# Patient Record
Sex: Female | Born: 1960 | Race: Black or African American | Hispanic: No | Marital: Married | Smoking: Former smoker
Health system: Southern US, Community
[De-identification: ages and names within clinical notes are randomized; demographics above are authoritative.]

## PROBLEM LIST (undated history)

## (undated) DIAGNOSIS — E669 Obesity, unspecified: Secondary | ICD-10-CM

## (undated) DIAGNOSIS — F4310 Post-traumatic stress disorder, unspecified: Secondary | ICD-10-CM

## (undated) DIAGNOSIS — E079 Disorder of thyroid, unspecified: Secondary | ICD-10-CM

## (undated) DIAGNOSIS — F259 Schizoaffective disorder, unspecified: Secondary | ICD-10-CM

## (undated) DIAGNOSIS — K644 Residual hemorrhoidal skin tags: Secondary | ICD-10-CM

## (undated) DIAGNOSIS — F3181 Bipolar II disorder: Secondary | ICD-10-CM

## (undated) DIAGNOSIS — I10 Essential (primary) hypertension: Secondary | ICD-10-CM

## (undated) DIAGNOSIS — R42 Dizziness and giddiness: Secondary | ICD-10-CM

## (undated) DIAGNOSIS — F250 Schizoaffective disorder, bipolar type: Secondary | ICD-10-CM

## (undated) HISTORY — DX: Residual hemorrhoidal skin tags: K64.4

## (undated) HISTORY — DX: Disorder of thyroid, unspecified: E07.9

## (undated) HISTORY — DX: Essential (primary) hypertension: I10

---

## 1996-07-17 HISTORY — PX: CHOLECYSTECTOMY: SHX55

## 2007-09-05 ENCOUNTER — Emergency Department (HOSPITAL_COMMUNITY): Admission: EM | Admit: 2007-09-05 | Discharge: 2007-09-06 | Payer: Self-pay | Admitting: Emergency Medicine

## 2007-12-23 ENCOUNTER — Emergency Department (HOSPITAL_COMMUNITY): Admission: EM | Admit: 2007-12-23 | Discharge: 2007-12-23 | Payer: Self-pay | Admitting: Emergency Medicine

## 2008-01-21 ENCOUNTER — Emergency Department (HOSPITAL_COMMUNITY): Admission: EM | Admit: 2008-01-21 | Discharge: 2008-01-21 | Payer: Self-pay | Admitting: Emergency Medicine

## 2009-07-17 HISTORY — PX: BUNIONECTOMY: SHX129

## 2009-08-30 ENCOUNTER — Emergency Department (HOSPITAL_COMMUNITY): Admission: EM | Admit: 2009-08-30 | Discharge: 2009-08-30 | Payer: Self-pay | Admitting: Emergency Medicine

## 2009-11-03 ENCOUNTER — Ambulatory Visit (HOSPITAL_COMMUNITY): Admission: RE | Admit: 2009-11-03 | Discharge: 2009-11-03 | Payer: Self-pay | Admitting: Obstetrics & Gynecology

## 2009-12-23 ENCOUNTER — Emergency Department (HOSPITAL_COMMUNITY): Admission: EM | Admit: 2009-12-23 | Discharge: 2009-12-23 | Payer: Self-pay | Admitting: Family Medicine

## 2010-09-19 ENCOUNTER — Emergency Department (HOSPITAL_COMMUNITY): Payer: Medicaid Other

## 2010-09-19 ENCOUNTER — Emergency Department (HOSPITAL_COMMUNITY)
Admission: EM | Admit: 2010-09-19 | Discharge: 2010-09-19 | Disposition: A | Discharge status: Home / Self Care | Payer: Medicaid Other | Attending: Emergency Medicine | Admitting: Emergency Medicine

## 2010-09-19 DIAGNOSIS — M542 Cervicalgia: Secondary | ICD-10-CM | POA: Insufficient documentation

## 2010-09-19 DIAGNOSIS — R51 Headache: Secondary | ICD-10-CM | POA: Insufficient documentation

## 2010-09-19 DIAGNOSIS — R5381 Other malaise: Secondary | ICD-10-CM | POA: Insufficient documentation

## 2010-09-19 DIAGNOSIS — Z79899 Other long term (current) drug therapy: Secondary | ICD-10-CM | POA: Insufficient documentation

## 2010-09-19 DIAGNOSIS — E039 Hypothyroidism, unspecified: Secondary | ICD-10-CM | POA: Insufficient documentation

## 2010-09-19 DIAGNOSIS — IMO0002 Reserved for concepts with insufficient information to code with codable children: Secondary | ICD-10-CM | POA: Insufficient documentation

## 2010-09-19 DIAGNOSIS — R350 Frequency of micturition: Secondary | ICD-10-CM | POA: Insufficient documentation

## 2010-09-19 DIAGNOSIS — R209 Unspecified disturbances of skin sensation: Secondary | ICD-10-CM | POA: Insufficient documentation

## 2010-09-19 DIAGNOSIS — M5412 Radiculopathy, cervical region: Secondary | ICD-10-CM | POA: Insufficient documentation

## 2010-09-19 DIAGNOSIS — I10 Essential (primary) hypertension: Secondary | ICD-10-CM | POA: Insufficient documentation

## 2010-09-19 LAB — POCT I-STAT, CHEM 8
BUN: 10 mg/dL (ref 6–23)
Calcium, Ion: 1.13 mmol/L (ref 1.12–1.32)
Creatinine, Ser: 1 mg/dL (ref 0.4–1.2)
Glucose, Bld: 71 mg/dL (ref 70–99)
HCT: 40 % (ref 36.0–46.0)
Hemoglobin: 13.6 g/dL (ref 12.0–15.0)
Potassium: 3.8 mEq/L (ref 3.5–5.1)
Sodium: 136 mEq/L (ref 135–145)

## 2010-09-19 LAB — URINALYSIS, ROUTINE W REFLEX MICROSCOPIC
Bilirubin Urine: NEGATIVE
Glucose, UA: NEGATIVE mg/dL
Hgb urine dipstick: NEGATIVE
Nitrite: NEGATIVE
Protein, ur: NEGATIVE mg/dL

## 2010-09-19 LAB — GLUCOSE, CAPILLARY

## 2010-09-19 LAB — POCT PREGNANCY, URINE: Preg Test, Ur: NEGATIVE

## 2010-10-04 ENCOUNTER — Other Ambulatory Visit (HOSPITAL_COMMUNITY): Payer: Self-pay | Admitting: Internal Medicine

## 2010-10-04 DIAGNOSIS — Z1231 Encounter for screening mammogram for malignant neoplasm of breast: Secondary | ICD-10-CM

## 2010-11-07 ENCOUNTER — Ambulatory Visit (HOSPITAL_COMMUNITY)
Admission: RE | Admit: 2010-11-07 | Discharge: 2010-11-07 | Disposition: A | Discharge status: Home / Self Care | Payer: Medicaid Other | Source: Ambulatory Visit | Attending: Internal Medicine | Admitting: Internal Medicine

## 2010-11-07 DIAGNOSIS — Z1231 Encounter for screening mammogram for malignant neoplasm of breast: Secondary | ICD-10-CM | POA: Insufficient documentation

## 2011-03-29 ENCOUNTER — Ambulatory Visit (INDEPENDENT_AMBULATORY_CARE_PROVIDER_SITE_OTHER): Payer: Self-pay | Admitting: General Surgery

## 2011-04-07 ENCOUNTER — Ambulatory Visit (INDEPENDENT_AMBULATORY_CARE_PROVIDER_SITE_OTHER): Payer: Self-pay | Admitting: General Surgery

## 2011-04-10 LAB — I-STAT 8, (EC8 V) (CONVERTED LAB)
BUN: 6
Chloride: 104
Glucose, Bld: 86
HCT: 39
Hemoglobin: 13.3
Sodium: 140
TCO2: 28
pH, Ven: 7.362 — ABNORMAL HIGH

## 2011-04-10 LAB — DIFFERENTIAL
Basophils Absolute: 0.1
Eosinophils Absolute: 0.3
Eosinophils Relative: 5
Lymphocytes Relative: 38
Lymphs Abs: 2.5
Monocytes Relative: 5
Neutro Abs: 3.3
Neutrophils Relative %: 51

## 2011-04-10 LAB — POCT CARDIAC MARKERS
CKMB, poc: 1.2
Myoglobin, poc: 48.2
Operator id: 270651

## 2011-04-10 LAB — CBC
HCT: 35 — ABNORMAL LOW
MCHC: 33.8
MCV: 89.7
Platelets: 254
RBC: 3.9
RDW: 13.3
WBC: 6.5

## 2011-04-10 LAB — TSH: TSH: 11.992 — ABNORMAL HIGH

## 2011-04-10 LAB — T4: T4, Total: 6.3

## 2011-04-13 LAB — CBC
MCV: 90.6
Platelets: 240
RBC: 3.59 — ABNORMAL LOW
WBC: 4.5

## 2011-04-13 LAB — COMPREHENSIVE METABOLIC PANEL
ALT: 11
Albumin: 3.7
Calcium: 9
Chloride: 107
Creatinine, Ser: 0.69
GFR calc non Af Amer: >60
Total Bilirubin: 0.6
Total Protein: 6.5

## 2011-04-13 LAB — URINALYSIS, ROUTINE W REFLEX MICROSCOPIC
Bilirubin Urine: NEGATIVE
Hgb urine dipstick: NEGATIVE
Nitrite: NEGATIVE
Protein, ur: NEGATIVE
Urobilinogen, UA: 1
pH: 5.5

## 2011-04-13 LAB — DIFFERENTIAL
Basophils Relative: 1
Eosinophils Absolute: 0.2
Lymphs Abs: 1.6
Monocytes Absolute: 0.2
Neutrophils Relative %: 54

## 2011-04-26 ENCOUNTER — Encounter (INDEPENDENT_AMBULATORY_CARE_PROVIDER_SITE_OTHER): Payer: Self-pay | Admitting: General Surgery

## 2011-04-26 ENCOUNTER — Ambulatory Visit (INDEPENDENT_AMBULATORY_CARE_PROVIDER_SITE_OTHER): Payer: Medicaid Other | Admitting: General Surgery

## 2011-04-26 VITALS — BP 132/86 | HR 80 | Temp 96.6°F | Resp 20 | Ht 65.0 in | Wt 252.0 lb

## 2011-04-26 DIAGNOSIS — K644 Residual hemorrhoidal skin tags: Secondary | ICD-10-CM

## 2011-04-26 NOTE — Progress Notes (Signed)
Chief Complaint  Patient presents with  . Other    new pt eval of rectal skin tag     HPI Anna Hamilton is a 50 y.o. female.   HPI   50 year old obese African American female referred by Dr. Kinnie Scales for evaluation of rectal skin tags. The patient states that she's had skin tags for many years. She underwent hemorrhoidal banding by Dr. Kinnie Scales a few months. She developed bleeding afterwards. She states he then performed a colonoscopy which was within normal limits, although I do not have an official report.  She states that these rectal skin tags will bleed when she wipes.  They also will occasionally itch.  She has daily BMs. She has no pain with defecation. She denies any incontinence. She is not a bathroom reader. She denies any stool caliber change.     Past Medical History  Diagnosis Date  . Thyroid disease     hypothyroid    Past Surgical History  Procedure Date  . Cholecystectomy 1998  . Bunionectomy 2011    History reviewed. No pertinent family history.  Social History History  Substance Use Topics  . Smoking status: Current Everyday Smoker -- 0.1 packs/day  . Smokeless tobacco: Never Used  . Alcohol Use: Yes    No Known Allergies  Current Outpatient Prescriptions  Medication Sig Dispense Refill  . levothyroxine (SYNTHROID, LEVOTHROID) 100 MCG tablet Take 100 mcg by mouth daily.          Review of Systems Review of Systems  Constitutional: Negative for fever, chills and unexpected weight change.  HENT: Negative for nosebleeds, congestion and neck stiffness.   Eyes: Negative for photophobia and visual disturbance.  Respiratory: Negative for cough, shortness of breath and wheezing.   Cardiovascular: Negative for chest pain and leg swelling.  Gastrointestinal:       See hpi  Genitourinary: Negative for dysuria, urgency, hematuria, decreased urine volume and difficulty urinating.  Musculoskeletal: Negative.   Neurological: Negative.   Hematological:  Negative.   Psychiatric/Behavioral: Negative.     Blood pressure 132/86, pulse 80, temperature 96.6 F (35.9 C), resp. rate 20, height 5\' 5"  (1.651 m), weight 252 lb (114.306 kg).  Physical Exam Physical Exam  Vitals reviewed. Constitutional: She is oriented to person, place, and time. She appears well-developed and well-nourished.       obese  HENT:  Head: Normocephalic and atraumatic.  Eyes: Conjunctivae are normal. No scleral icterus.  Neck: Normal range of motion. Neck supple. No JVD present. No tracheal deviation present. No thyromegaly present.  Cardiovascular: Normal rate, regular rhythm and normal heart sounds.   Pulmonary/Chest: Effort normal. No respiratory distress.  Abdominal: Soft. Bowel sounds are normal. She exhibits no distension. There is no tenderness. There is no guarding.  Genitourinary: Rectal exam shows external hemorrhoid (nonthrombosed, redundant ext hem x2 (L posterolateral & smaller Rt anterior). Rectal exam shows no fissure, no mass, no tenderness and anal tone normal.  Musculoskeletal: Normal range of motion. She exhibits no edema.  Neurological: She is alert and oriented to person, place, and time. She exhibits normal muscle tone.  Skin: Skin is warm and dry. No rash noted.  Psychiatric: She has a normal mood and affect. Her behavior is normal. Thought content normal.    Data Reviewed None available  Data Requested Dr [facility] office note & the pt's colonoscopy report  Assessment    Bleeding nonthrombosed external hemorrhoids x 2    Plan    We discussed the etiology of  external hemorrhoids. The patient was given Agricultural engineer. We discussed operative and nonoperative management. We discussed the risks and benefits of surgery including but not limited to bleeding, infection, injury to surrounding structures, blood clot formation, general anesthesia risk, urinary retention. We talked about the typical postoperative course. I informed the patient  that she should expect moderate improvement in her symptoms after surgery. I explained to the patient that she will not be completely smooth around her perianal area. We also discussed the importance of not becoming constipated after surgery as this would worsen her postoperative pain. We also discussed observation of this area.  The patient is going to discuss her options with her husband and contact our office if she would like to proceed with surgery. Otherwise I will see her on an as-needed basis. I have requested a a copy of her colonoscopy report.       Gaynelle Adu M 04/26/2011, 10:50 AM

## 2011-04-26 NOTE — Patient Instructions (Signed)
Call our office at [phone] if you would like to schedule surgery

## 2011-05-08 ENCOUNTER — Telehealth (INDEPENDENT_AMBULATORY_CARE_PROVIDER_SITE_OTHER): Payer: Self-pay | Admitting: General Surgery

## 2011-05-08 ENCOUNTER — Encounter (HOSPITAL_COMMUNITY)
Admission: RE | Admit: 2011-05-08 | Discharge: 2011-05-08 | Disposition: A | Discharge status: Home / Self Care | Payer: Medicaid Other | Source: Ambulatory Visit | Attending: General Surgery | Admitting: General Surgery

## 2011-05-08 ENCOUNTER — Other Ambulatory Visit (INDEPENDENT_AMBULATORY_CARE_PROVIDER_SITE_OTHER): Payer: Self-pay | Admitting: General Surgery

## 2011-05-08 ENCOUNTER — Ambulatory Visit (HOSPITAL_COMMUNITY)
Admission: RE | Admit: 2011-05-08 | Discharge: 2011-05-08 | Disposition: A | Discharge status: Home / Self Care | Payer: Medicaid Other | Source: Ambulatory Visit | Attending: General Surgery | Admitting: General Surgery

## 2011-05-08 DIAGNOSIS — Z01818 Encounter for other preprocedural examination: Secondary | ICD-10-CM | POA: Insufficient documentation

## 2011-05-08 DIAGNOSIS — K644 Residual hemorrhoidal skin tags: Secondary | ICD-10-CM

## 2011-05-08 DIAGNOSIS — I517 Cardiomegaly: Secondary | ICD-10-CM | POA: Insufficient documentation

## 2011-05-08 DIAGNOSIS — Z01812 Encounter for preprocedural laboratory examination: Secondary | ICD-10-CM | POA: Insufficient documentation

## 2011-05-08 LAB — BASIC METABOLIC PANEL
BUN: 7 mg/dL (ref 6–23)
GFR calc Af Amer: >90 mL/min (ref >90)
GFR calc non Af Amer: >90 mL/min (ref >90)
Potassium: 3.5 mEq/L (ref 3.5–5.1)
Sodium: 138 mEq/L (ref 135–145)

## 2011-05-08 LAB — CBC
Platelets: 267 10*3/uL (ref 150–400)
RDW: 13.4 % (ref 11.5–15.5)
WBC: 6 10*3/uL (ref 4.0–10.5)

## 2011-05-08 LAB — SURGICAL PCR SCREEN: Staphylococcus aureus: NEGATIVE

## 2011-05-08 NOTE — Telephone Encounter (Signed)
Labs okay for surgery faxed to pre-op.  

## 2011-05-08 NOTE — Telephone Encounter (Signed)
Message copied by Liliana Cline on Mon May 08, 2011  1:33 PM ------      Message from: Andrey Campanile, ERIC M      Created: Mon May 08, 2011  1:09 PM       Labs ok for surgery

## 2011-05-12 ENCOUNTER — Ambulatory Visit (HOSPITAL_COMMUNITY)
Admission: RE | Admit: 2011-05-12 | Discharge: 2011-05-12 | Disposition: A | Discharge status: Home / Self Care | Payer: Medicaid Other | Source: Ambulatory Visit | Attending: General Surgery | Admitting: General Surgery

## 2011-05-12 DIAGNOSIS — F17200 Nicotine dependence, unspecified, uncomplicated: Secondary | ICD-10-CM | POA: Insufficient documentation

## 2011-05-12 DIAGNOSIS — K649 Unspecified hemorrhoids: Secondary | ICD-10-CM

## 2011-05-12 DIAGNOSIS — I10 Essential (primary) hypertension: Secondary | ICD-10-CM | POA: Insufficient documentation

## 2011-05-12 DIAGNOSIS — K644 Residual hemorrhoidal skin tags: Secondary | ICD-10-CM | POA: Insufficient documentation

## 2011-05-12 DIAGNOSIS — E669 Obesity, unspecified: Secondary | ICD-10-CM | POA: Insufficient documentation

## 2011-05-12 HISTORY — PX: HEMORRHOID SURGERY: SHX153

## 2011-05-20 NOTE — Op Note (Signed)
NAMEARNEISHA, KINCANNON NO.:  [id number]  MEDICAL RECORD NO.:  [id number]  LOCATION:  [facility]                         FACILITY:  [facility]  PHYSICIAN:  Mary Sella. Andrey Campanile, MD     DATE OF BIRTH:  April 19, 1961  DATE OF PROCEDURE:  05/12/2011 DATE OF DISCHARGE:                              OPERATIVE REPORT   PREOPERATIVE DIAGNOSIS:  External hemorrhoids.  POSTOPERATIVE DIAGNOSIS:  External hemorrhoids x2.  PROCEDURES: 1. Exam under anesthesia. 2. Excision of external hemorrhoid x2.  SURGEON:  Mary Sella. Andrey Campanile, MD  ANESTHESIA:  General plus 20 mL of Exparel.  ESTIMATED BLOOD LOSS:  Minimal.  COMPLICATIONS:  None immediately apparent.  FINDINGS:  There is redundant nonthrombosed external hemorrhoidal tissue in the left anterior position which was small but in the posterior position bridging the posterior midline.  There was a large nonthrombosed hemorrhoid in the right posterior position going over to the left side.  There is no signs of rectal ulcers or fissures on anoscopy.  She had good rectal tone.  There is essentially no internal hemorrhoids.  INDICATION FOR PROCEDURE:  The patient is an obese 50 year old female who was referred by Dr. Kinnie Scales for evaluation of rectal skin tags.  She had undergone hemorrhoidal banding a few months ago.  She states that anytime she wiped her bottom after having a bowel movement that the rectal skin tags would bleed and occasion they would itch.  We discussed the risks and benefits of surgery including but not limited to bleeding, infection, urinary retention, scarring, postoperative pain, failure to ameliorate all of her symptoms, blood clot formation and general anesthesia risks.  I explained to her that she should expect moderate improvement in her symptoms.  I explained to her that we would have a very challenging time and not be able to get her completely smooth around her perianal area.  She elects to proceed to  surgery.  DESCRIPTION OF PROCEDURE:  After obtaining informed consent, the patient was taken back to the operating room.  General endotracheal anesthesia was established.  Sequential compression devices were placed.  She was then placed in the prone jack-knife position with the appropriate padding.  Her buttocks were taped apart with tape.  Her buttocks and perineum were prepped with Betadine.  She received antibiotics prior to skin incision.  Surgical time-out was performed  On gross inspection, there was redundant nonthrombosed hemorrhoidal tissue in the left anterior position and there was a large redundant nonthrombosed external hemorrhoid in the right posterior position extending to the left.  There were no signs of fissures.  I then dilated her anus sequentially up to 2 fingers.  I then placed the anoscope within and inspected her anal canal.  There was no evidence of solitary rectal ulcers, if any very minor grade 1 internal hemorrhoids just 1 position but it was not overtly impressive.  I then excised the external hemorrhoid.  I outlined the base of the external hemorrhoid with a marking pen.  I then incised the skin with a #15 blade and sharply excised it with the knife as well as with electrocautery.  I started in the left anterior position.  No muscle  was violated.  Hemostasis was achieved.  I then closed the left anterior incision with a running 3-0 chromic suture leaving a small gap at the end for drainage.  The length of the incision was about little over 1 cm.  The incision was somewhat horizontal and not radial.  With respect to the right posterior external hemorrhoid, I outlined the base of it with a marking pen.  It essentially was an elliptical incision which was made sharply with a #15 blade.  Again no muscle fibers were violated.  The external hemorrhoid was excised in its entirety.  This left again a sort of horizontal incision to be closed.  I closed it with  interrupted 3-0 chromic sutures incorporating a bite of the mucosa and the skin and a little bit of the muscle fiber underneath to ameliorate the dead space.  A small gap was left at the end for drainage.  I then injected Exparel and a local in regional fashion and dibucaine ointment was applied, 4x4s, mesh underwear were then applied.  The patient was then placed in the supine position, extubated and taken to the recovery room in stable condition. All needle, instrument, and sponge counts were correct x2.  There were no immediate complications.  The patient tolerated the procedure well.     Mary Sella. Andrey Campanile, MD     EMW/MEDQ  D:  05/12/2011  T:  05/12/2011  Job:  [id number]  cc:   Griffith Citron, M.D. Della Goo, M.D.  Electronically Signed by Gaynelle Adu M.D. on 05/20/2011 12:33:43 PM

## 2011-05-31 ENCOUNTER — Ambulatory Visit (INDEPENDENT_AMBULATORY_CARE_PROVIDER_SITE_OTHER): Payer: Medicaid Other | Admitting: General Surgery

## 2011-05-31 ENCOUNTER — Encounter (INDEPENDENT_AMBULATORY_CARE_PROVIDER_SITE_OTHER): Payer: Self-pay | Admitting: General Surgery

## 2011-05-31 VITALS — BP 142/96 | HR 68 | Temp 96.9°F | Resp 16 | Ht 65.0 in | Wt 256.4 lb

## 2011-05-31 DIAGNOSIS — Z09 Encounter for follow-up examination after completed treatment for conditions other than malignant neoplasm: Secondary | ICD-10-CM

## 2011-05-31 MED ORDER — OXYCODONE-ACETAMINOPHEN 5-325 MG PO TABS: 1.0000 | ORAL_TABLET | ORAL | Status: DC | PRN

## 2011-05-31 NOTE — Progress Notes (Signed)
Chief complaint: Postop  Procedure: Status post exam under anesthesia, excision of external hemorrhoids x2 May 12, 2011  History of Present Ilness: 50 year old obese African American female comes in today for her first postoperative appointment. She states that she did not have any pain for the first 3 days. She started to have to take the pain pills on postop day 4. About a week after surgery, she developed pretty intense rectal pain. She also had described itching and burning. She states occasionally it feels like a knife is sticking her. She is having several bowel movements a day. She denies any incontinence. She is drinking prune juice and mineral oil. She is taking fiber supplements. She is also soaking in a bath tub several times a day.  Physical Exam: BP 142/96  Pulse 68  Temp(Src) 96.9 F (36.1 C) (Temporal)  Resp 16  Ht 5\' 5"  (1.651 m)  Wt 256 lb 6.4 oz (116.302 kg)  BMI 42.67 kg/m2  Well-developed well-nourished obese African female no apparent distress Pulmonary-lungs are clear Cardiac-regular rate and rhythm Abdomen-soft, nontender, obese Rectal-the posterior incision has opened up. The skin is separated. There is no sign of cellulitis or fluctuance. The length of the opening is about 2-1/2 cm in a circumferential manner around the posterior midline. Digital rectal exam was deferred  I reviewed my operative note  Assessment and Plan: Status post exam under anesthesia, excision of 2 external hemorrhoids  It appears that her incisions have opened up. However there is no side of wound infection. I explained that her wounds would have to heal from the bottom up. I encouraged her to continue her current bowel regimen. I gave her a prescription for Vicodin. I'll see her in 6 weeks

## 2011-05-31 NOTE — Patient Instructions (Signed)
Keep doing what you're doing- prune juice, fiber, mineral oil

## 2011-07-21 ENCOUNTER — Ambulatory Visit (INDEPENDENT_AMBULATORY_CARE_PROVIDER_SITE_OTHER): Payer: Medicaid Other | Admitting: General Surgery

## 2011-07-21 ENCOUNTER — Encounter (INDEPENDENT_AMBULATORY_CARE_PROVIDER_SITE_OTHER): Payer: Self-pay | Admitting: General Surgery

## 2011-07-21 VITALS — BP 138/86 | HR 64 | Temp 96.9°F | Resp 16 | Ht 65.0 in | Wt 265.4 lb

## 2011-07-21 DIAGNOSIS — Z09 Encounter for follow-up examination after completed treatment for conditions other than malignant neoplasm: Secondary | ICD-10-CM

## 2011-07-21 DIAGNOSIS — K648 Other hemorrhoids: Secondary | ICD-10-CM

## 2011-07-21 NOTE — Progress Notes (Signed)
Chief complaint: Postop  Procedure: Status post exam under anesthesia, excision of external hemorrhoids x2 May 12, 2011  History of Present Ilness: 51 year old obese African American female comes in today for her second postoperative appointment. She is having several bowel movements a day. She denies any incontinence. She is drinking prune juice and mineral oil. She is taking fiber supplements. She denies any pain with defecation, hematochezia, perianal itching or burning. She states she thinks a hemorrhoid comes out when she has a BM but spontaneously reduces.   Physical Exam: BP 138/86  Pulse 64  Temp(Src) 96.9 F (36.1 C) (Temporal)  Resp 16  Ht 5\' 5"  (1.651 m)  Wt 265 lb 6.4 oz (120.385 kg)  BMI 44.16 kg/m2  Well-developed well-nourished obese African female no apparent distress Pulmonary-lungs are clear Cardiac-regular rate and rhythm Abdomen-soft, nontender, obese Rectal-the incision as completely healed. Small trace amount of redundant ant ext hemorrhoidal tissue.  There is no sign of cellulitis or fluctuance.  Digital rectal exam showed normal tone. Anoscopy revealed an internal hemorrhoid in the left posterolateral position.   I reviewed my operative note  Assessment and Plan: Status post exam under anesthesia, excision of 2 external hemorrhoids Grade 2 Left internal hemorrhoid.   The incision has healed, but she has developed a grade 2 internal hemorrhoid.  We discussed management of grade 2 internal hemorrhoids. We discussed continuing non-surgical management versus hemorrhoidal banding. We discussed the risk and benefits of hemorrhoidal banding. The patient has elected to proceed with hemorrhoidal banding.  After obtaining verbal consent, the anoscope was replaced into the patient's rectum. The left posterior lateral hemorrhoid was grasped. It was insensate. 2 rubber bands were placed at the base of the hemorrhoid. The patient tolerated the procedure well. She had  no pain at the conclusion of the procedure. She was given post banding instructions.  Followup in 6 weeks  Mary Sella. Andrey Campanile, MD, FACS General, Bariatric, & Minimally Invasive Surgery [facility], [place]

## 2011-07-21 NOTE — Patient Instructions (Signed)
Hemorrhoid Banding Hemorrhoids are veins in the anus and lower rectum that become enlarged. The most common symptoms are rectal bleeding, itching, and sometimes pain. Hemorrhoids might come out with straining or having a bowel movement, and they can sometimes be pushed back in. There are internal and external hemorrhoids. Only internal hemorrhoids can be treated with banding. In this procedure, a rubber band is placed near the hemorrhoid tissue, cutting off the blood supply. This procedure prevents the hemorrhoids from slipping down. LET YOUR CAREGIVER KNOW ABOUT: All medicines you are taking, especially blood thinners such as aspirin and coumadin.  RISKS AND COMPLICATIONS This is not a painful procedure, but if you do have intense pain immediately let your surgeon know because the band may need to be removed. You may have some mild pain or discomfort in the first 2 days or so after treatment. Sometimes there may be delayed bleeding in the first week after treatment.  BEFORE THE PROCEDURE  There is no special preparation needed before banding. Your surgeon may have you do an enema prior to the procedure. You will go home the same day.  HOME CARE INSTRUCTIONS   Your surgeon might instruct you to do sitz baths as needed if you have discomfort or after a bowel movement.   You may be instructed to use fiber supplements.  SEEK MEDICAL CARE IF:  You have an increase in pain.   Your pain does not get better.  SEEK IMMEDIATE MEDICAL CARE IF:  You have intense pain.   Fever greater than 100.5 F (38.1 C).   Bleeding that does not stop, or pus from the anus.  Document Released: 04/30/2009 Document Revised: 03/15/2011 Document Reviewed: 04/30/2009 [facility] Patient Information 2012 [place], [place].

## 2011-07-24 ENCOUNTER — Encounter (INDEPENDENT_AMBULATORY_CARE_PROVIDER_SITE_OTHER): Payer: Self-pay | Admitting: Gastroenterology

## 2011-08-30 ENCOUNTER — Encounter (INDEPENDENT_AMBULATORY_CARE_PROVIDER_SITE_OTHER): Payer: Medicaid Other | Admitting: General Surgery

## 2011-09-20 ENCOUNTER — Encounter (INDEPENDENT_AMBULATORY_CARE_PROVIDER_SITE_OTHER): Payer: Self-pay | Admitting: General Surgery

## 2011-09-20 ENCOUNTER — Ambulatory Visit (INDEPENDENT_AMBULATORY_CARE_PROVIDER_SITE_OTHER): Payer: Medicaid Other | Admitting: General Surgery

## 2011-09-20 VITALS — BP 132/76 | HR 64 | Temp 97.8°F | Resp 18 | Ht 65.0 in | Wt 271.4 lb

## 2011-09-20 DIAGNOSIS — Z09 Encounter for follow-up examination after completed treatment for conditions other than malignant neoplasm: Secondary | ICD-10-CM

## 2011-09-20 NOTE — Patient Instructions (Signed)
Keep drinking 6-8 glasses of water per day and eating a high fiber diet

## 2011-09-20 NOTE — Progress Notes (Signed)
Procedure: Status post exam under anesthesia, excision of two external hemorrhoids May 12, 2011 Status post hemorrhoidal banding of left internal hemorrhoid January 4  History of Present Ilness: 51 year old [place] American female comes in for followup. At her last appointment, we banded an internal hemorrhoid on the left. She states that she did quite well afterwards. She had a little bit of discomfort that evening which was relieved with Tylenol. She denies any pain with defecation, perianal itching or burning, melena, hematochezia, diarrhea, incontinence, or constipation. She reports normal daily bowel movements. She denies anything protruding from her anus while having a bowel movement.  She is interested in hearing more about LAP-BAND surgery  Physical Exam: BP 132/76  Pulse 64  Temp(Src) 97.8 F (36.6 C) (Temporal)  Resp 18  Ht 5\' 5"  (1.651 m)  Wt 271 lb 6.4 oz (123.106 kg)  BMI 45.16 kg/m2  Alert, nad Rectal- minor redundant ext hemorrhoidal tissue, no cellulitis. No fluctuance. DRE- good tone. No masses Limited Anoscopy on left lateral side- a little scar from band site. No sign of ulcer   Assessment and Plan: Status post banding of internal left hemorrhoid. She appears her doing quite well with respect for hemorrhoidal problems. There are no active hemorrhoidal issues at this time.  I encouraged her to continue the fiber diet and to practice good bowel habits.  She was given information about our next weight loss surgery seminar  Mary Sella. Andrey Campanile, MD, FACS General, Bariatric, & Minimally Invasive Surgery [facility], [place]

## 2011-10-17 ENCOUNTER — Other Ambulatory Visit (HOSPITAL_COMMUNITY): Payer: Self-pay | Admitting: Internal Medicine

## 2011-10-17 DIAGNOSIS — Z1231 Encounter for screening mammogram for malignant neoplasm of breast: Secondary | ICD-10-CM

## 2011-11-21 ENCOUNTER — Ambulatory Visit (HOSPITAL_COMMUNITY)
Admission: RE | Admit: 2011-11-21 | Discharge: 2011-11-21 | Disposition: A | Discharge status: Home / Self Care | Payer: Medicaid Other | Source: Ambulatory Visit | Attending: Internal Medicine | Admitting: Internal Medicine

## 2011-11-21 DIAGNOSIS — Z1231 Encounter for screening mammogram for malignant neoplasm of breast: Secondary | ICD-10-CM | POA: Insufficient documentation

## 2012-01-31 ENCOUNTER — Encounter (HOSPITAL_COMMUNITY): Payer: Self-pay | Admitting: *Deleted

## 2012-01-31 ENCOUNTER — Emergency Department (HOSPITAL_COMMUNITY)
Admission: EM | Admit: 2012-01-31 | Discharge: 2012-01-31 | Disposition: A | Discharge status: Home / Self Care | Payer: Medicaid Other | Attending: Emergency Medicine | Admitting: Emergency Medicine

## 2012-01-31 DIAGNOSIS — E079 Disorder of thyroid, unspecified: Secondary | ICD-10-CM | POA: Insufficient documentation

## 2012-01-31 DIAGNOSIS — X58XXXA Exposure to other specified factors, initial encounter: Secondary | ICD-10-CM | POA: Insufficient documentation

## 2012-01-31 DIAGNOSIS — Z9089 Acquired absence of other organs: Secondary | ICD-10-CM | POA: Insufficient documentation

## 2012-01-31 DIAGNOSIS — S161XXA Strain of muscle, fascia and tendon at neck level, initial encounter: Secondary | ICD-10-CM

## 2012-01-31 DIAGNOSIS — F17200 Nicotine dependence, unspecified, uncomplicated: Secondary | ICD-10-CM | POA: Insufficient documentation

## 2012-01-31 DIAGNOSIS — M5412 Radiculopathy, cervical region: Secondary | ICD-10-CM | POA: Insufficient documentation

## 2012-01-31 DIAGNOSIS — I10 Essential (primary) hypertension: Secondary | ICD-10-CM | POA: Insufficient documentation

## 2012-01-31 DIAGNOSIS — S139XXA Sprain of joints and ligaments of unspecified parts of neck, initial encounter: Secondary | ICD-10-CM | POA: Insufficient documentation

## 2012-01-31 MED ORDER — CYCLOBENZAPRINE HCL 10 MG PO TABS: 10.0000 mg | ORAL_TABLET | Freq: Two times a day (BID) | ORAL | Status: AC | PRN

## 2012-01-31 MED ORDER — KETOROLAC TROMETHAMINE 60 MG/2ML IM SOLN
60.0000 mg | Freq: Once | INTRAMUSCULAR | Status: AC
  Administered 2012-01-31: 60 mg via INTRAMUSCULAR
  Filled 2012-01-31: qty 2

## 2012-01-31 MED ORDER — IBUPROFEN 600 MG PO TABS: 600.0000 mg | ORAL_TABLET | Freq: Four times a day (QID) | ORAL | Status: AC | PRN

## 2012-01-31 NOTE — ED Provider Notes (Signed)
History     CSN: [id number]  Arrival date & time 01/31/12  1015   First MD Initiated Contact with Patient 01/31/12 1246      Chief Complaint  Patient presents with  . Neck Pain    (Consider location/radiation/quality/duration/timing/severity/associated sxs/prior treatment) HPI Comments: Patient here with ongoing neck pain worsening over the past few days. Saw her PCP 2 days ago for this and was prescribed neurontin, given script for xray, and told to f/u with neuro. Has not yet called neuro, and states neurontin is not helping her pain. Has not tried to take anything else. Rates pain 8-10/10, states it is constant with random episodes of more intense sharp pain. Has tingling in her right 4th and 5th digit. Pain was so intense last night she became nauseated and vomited once. No known injury or trauma. Admits she has been lifting her grandchildren up more often than normal and may have strained herself a little too much. Denies any neck stiffness, fever, chills, dizziness, vision changes.  Patient is a 51 y.o. female presenting with neck pain. The history is provided by the patient.  Neck Pain  Pertinent negatives include no chest pain.    Past Medical History  Diagnosis Date  . Thyroid disease     hypothyroid  . External hemorrhoids   . Hypertension     Past Surgical History  Procedure Date  . Cholecystectomy 1998  . Bunionectomy 2011  . Hemorrhoid surgery 05/12/11    external    No family history on file.  History  Substance Use Topics  . Smoking status: Current Some Day Smoker -- 0.2 packs/day  . Smokeless tobacco: Never Used  . Alcohol Use: Yes    OB History    Grav Para Term Preterm Abortions TAB SAB Ect Mult Living                  Review of Systems  Constitutional: Negative for fever and chills.  HENT: Positive for neck pain. Negative for neck stiffness.   Eyes: Negative for visual disturbance.  Respiratory: Negative for shortness of breath.     Cardiovascular: Negative for chest pain.  Gastrointestinal: Nausea: subsided. Vomiting: once last night.  Musculoskeletal: Negative for back pain.  Skin: Negative for rash.  Neurological:       Tingling in right arm    Allergies  Review of patient's allergies indicates no known allergies.  Home Medications   Current Outpatient Rx  Name Route Sig Dispense Refill  . VITAMIN D3 5000 UNITS PO TABS Oral Take 5,000 mg by mouth daily.    Marland Kitchen LEVOTHYROXINE SODIUM 100 MCG PO TABS Oral Take 100 mcg by mouth daily.        BP 155/91  Pulse 63  Temp 97.8 F (36.6 C) (Oral)  Resp 16  Ht 5\' 5"  (1.651 m)  Wt 275 lb (124.739 kg)  BMI 45.76 kg/m2  SpO2 97%  Physical Exam  Nursing note and vitals reviewed. Constitutional: She appears well-developed and well-nourished. No distress.  HENT:  Head: Normocephalic and atraumatic.  Eyes: Conjunctivae are normal. Pupils are equal, round, and reactive to light.  Neck: Normal range of motion. Neck supple. Muscular tenderness (TTP over right trapezius muscle) present. No spinous process tenderness present. No rigidity. No edema and no erythema present. No Brudzinski's sign and no Kernig's sign noted.  Cardiovascular: Normal rate, regular rhythm and normal heart sounds.   Pulmonary/Chest: Effort normal.  Abdominal: Soft. Bowel sounds are normal. There is no tenderness.  Musculoskeletal:       Right shoulder: Normal.       Left shoulder: Normal.  Neurological: She has normal reflexes. A sensory deficit (decreased sensation to light touch along c8 dermatome most prominent on dorsal aspect of right hand) is present.       Strength right UE 4/5 compared to left UE 5/5.  Skin: No rash noted.    ED Course  Procedures (including critical care time)  Labs Reviewed - No data to display No results found.   No diagnosis found. Dx- neck strain, cervical radiculopathy   MDM  51 y/o with ongoing neck pain worsening over the past few days. PCP referred  her to neurology. No red flags such as fever, rashes, neck stiffness making meningitis unlikely dx. Exam consistent with trapezius/neck strain. Pain control with toradol.  1:58 PM Patient no longer with sharp pains, just a little sore. Decreased ttp of right trapezius. Patient ready for discharge. Explained imporance of anti-inflammatory and rest. Aware to not drive or operate heavy machinery with flexeril. Will f/u with neuro as advised by PCP.      Trevor Mace, PA-C 01/31/12 1402

## 2012-01-31 NOTE — ED Notes (Signed)
Patient reports she is now having numbness in her right arm

## 2012-01-31 NOTE — ED Notes (Signed)
Patient reports she is having neck pain.  She has been seen for same and she has been referred to neuro.  Patient unable to tolerate pain so she is here.  Patient states she has not had her neck xrayed.  She states her pain was so bad that she had n/v last night.  She is also having tingling in her right hand

## 2012-02-02 NOTE — ED Provider Notes (Signed)
Medical screening examination/treatment/procedure(s) were performed by non-physician practitioner and as supervising physician I was immediately available for consultation/collaboration.   Sharise Lippy, MD 02/02/12 1428 

## 2012-02-05 ENCOUNTER — Emergency Department (HOSPITAL_COMMUNITY)
Admission: EM | Admit: 2012-02-05 | Discharge: 2012-02-05 | Disposition: A | Discharge status: Home / Self Care | Payer: Medicaid Other | Attending: Emergency Medicine | Admitting: Emergency Medicine

## 2012-02-05 ENCOUNTER — Ambulatory Visit (HOSPITAL_COMMUNITY)
Admission: RE | Admit: 2012-02-05 | Discharge: 2012-02-05 | Disposition: A | Discharge status: Home / Self Care | Payer: Medicaid Other | Source: Ambulatory Visit | Attending: Internal Medicine | Admitting: Internal Medicine

## 2012-02-05 ENCOUNTER — Other Ambulatory Visit (HOSPITAL_COMMUNITY): Payer: Self-pay | Admitting: Internal Medicine

## 2012-02-05 ENCOUNTER — Encounter (HOSPITAL_COMMUNITY): Payer: Self-pay | Admitting: *Deleted

## 2012-02-05 DIAGNOSIS — R52 Pain, unspecified: Secondary | ICD-10-CM

## 2012-02-05 DIAGNOSIS — M542 Cervicalgia: Secondary | ICD-10-CM | POA: Insufficient documentation

## 2012-02-05 DIAGNOSIS — M5090 Cervical disc disorder, unspecified, unspecified cervical region: Secondary | ICD-10-CM | POA: Insufficient documentation

## 2012-02-05 MED ORDER — HYDROCODONE-ACETAMINOPHEN 5-325 MG PO TABS
2.0000 | ORAL_TABLET | Freq: Once | ORAL | Status: AC
  Administered 2012-02-05: 2 via ORAL
  Filled 2012-02-05: qty 2

## 2012-02-05 MED ORDER — PREDNISONE 10 MG PO TABS: 20.0000 mg | ORAL_TABLET | Freq: Two times a day (BID) | ORAL | Status: DC

## 2012-02-05 MED ORDER — HYDROCODONE-ACETAMINOPHEN 5-500 MG PO TABS: 1.0000 | ORAL_TABLET | Freq: Four times a day (QID) | ORAL | Status: AC | PRN

## 2012-02-05 NOTE — ED Notes (Addendum)
Pt states that she just had outpatient xray for continued pain to back of neck on right side. Pt states she is here for continued discomfort, was here on the 17th for same. Pt reports taking flexeril pta. No fevers.

## 2012-02-15 NOTE — ED Provider Notes (Signed)
History     CSN: [id number]  Arrival date & time 02/05/12  1120   First MD Initiated Contact with Patient 02/05/12 1245      Chief Complaint  Patient presents with  . Neck Pain    (Consider location/radiation/quality/duration/timing/severity/associated sxs/prior treatment) Patient is a 51 y.o. female presenting with neck pain. The history is provided by the patient.  Neck Pain  This is a new problem. The current episode started more than 1 week ago. The problem occurs constantly. The problem has been gradually worsening. The pain is associated with nothing. There has been no fever. The pain is present in the generalized neck. The quality of the pain is described as stabbing. The pain radiates to the left arm and right arm. The pain is severe. The symptoms are aggravated by twisting (turning head). The pain is the same all the time. Pertinent negatives include no numbness and no weakness. She has tried NSAIDs for the symptoms. The treatment provided no relief.    Past Medical History  Diagnosis Date  . Thyroid disease     hypothyroid  . External hemorrhoids   . Hypertension     Past Surgical History  Procedure Date  . Cholecystectomy 1998  . Bunionectomy 2011  . Hemorrhoid surgery 05/12/11    external    History reviewed. No pertinent family history.  History  Substance Use Topics  . Smoking status: Current Some Day Smoker -- 0.2 packs/day  . Smokeless tobacco: Never Used  . Alcohol Use: Yes    OB History    Grav Para Term Preterm Abortions TAB SAB Ect Mult Living                  Review of Systems  HENT: Positive for neck pain.   Neurological: Negative for weakness and numbness.  All other systems reviewed and are negative.    Allergies  Review of patient's allergies indicates no known allergies.  Home Medications   Current Outpatient Rx  Name Route Sig Dispense Refill  . VITAMIN D3 5000 UNITS PO TABS Oral Take 5,000 mg by mouth daily.    Marland Kitchen GABAPENTIN  600 MG PO TABS Oral Take 600 mg by mouth 3 (three) times daily.    Marland Kitchen LEVOTHYROXINE SODIUM 100 MCG PO TABS Oral Take 100 mcg by mouth daily.      Marland Kitchen HYDROCODONE-ACETAMINOPHEN 5-500 MG PO TABS Oral Take 1-2 tablets by mouth every 6 (six) hours as needed for pain. 20 tablet 0  . PREDNISONE 10 MG PO TABS Oral Take 2 tablets (20 mg total) by mouth 2 (two) times daily. 20 tablet 0    BP 143/83  Pulse 56  Temp 98.2 F (36.8 C) (Oral)  Resp 16  SpO2 97%  LMP 02/05/2012  Physical Exam  Nursing note and vitals reviewed. Constitutional: She is oriented to person, place, and time. She appears well-developed and well-nourished. No distress.  HENT:  Head: Normocephalic and atraumatic.  Neck: Normal range of motion. Neck supple.  Cardiovascular: Normal rate and regular rhythm.   Pulmonary/Chest: Effort normal and breath sounds normal. No respiratory distress. She has no wheezes.  Musculoskeletal: Normal range of motion.       Strength is 5/5 in the bue and ble.  Neurological: She is alert and oriented to person, place, and time.  Skin: Skin is warm and dry. She is not diaphoretic.    ED Course  Procedures (including critical care time)  Labs Reviewed - No data to display No  results found.   1. Neck pain       MDM  Sounds musculoskeletal.  Will treat with pain meds, nsaids.  Follow up with pcp if not improving.        Geoffery Lyons, MD 02/15/12 [phone]

## 2012-02-21 ENCOUNTER — Other Ambulatory Visit: Payer: Self-pay | Admitting: Neurosurgery

## 2012-02-21 DIAGNOSIS — M47812 Spondylosis without myelopathy or radiculopathy, cervical region: Secondary | ICD-10-CM

## 2012-02-21 DIAGNOSIS — M5020 Other cervical disc displacement, unspecified cervical region: Secondary | ICD-10-CM

## 2012-02-21 DIAGNOSIS — M4802 Spinal stenosis, cervical region: Secondary | ICD-10-CM

## 2012-02-21 DIAGNOSIS — M5030 Other cervical disc degeneration, unspecified cervical region: Secondary | ICD-10-CM

## 2012-03-06 ENCOUNTER — Ambulatory Visit
Admission: RE | Admit: 2012-03-06 | Discharge: 2012-03-06 | Disposition: A | Discharge status: Home / Self Care | Payer: Medicaid Other | Source: Ambulatory Visit | Attending: Neurosurgery | Admitting: Neurosurgery

## 2012-03-06 DIAGNOSIS — M4802 Spinal stenosis, cervical region: Secondary | ICD-10-CM

## 2012-03-06 DIAGNOSIS — M5020 Other cervical disc displacement, unspecified cervical region: Secondary | ICD-10-CM

## 2012-03-06 DIAGNOSIS — M5030 Other cervical disc degeneration, unspecified cervical region: Secondary | ICD-10-CM

## 2012-03-06 DIAGNOSIS — M47812 Spondylosis without myelopathy or radiculopathy, cervical region: Secondary | ICD-10-CM

## 2012-04-09 ENCOUNTER — Ambulatory Visit: Payer: Medicaid Other | Attending: Neurosurgery | Admitting: Physical Therapy

## 2012-04-09 DIAGNOSIS — IMO0001 Reserved for inherently not codable concepts without codable children: Secondary | ICD-10-CM | POA: Insufficient documentation

## 2012-04-09 DIAGNOSIS — M542 Cervicalgia: Secondary | ICD-10-CM | POA: Insufficient documentation

## 2012-04-09 DIAGNOSIS — M25519 Pain in unspecified shoulder: Secondary | ICD-10-CM | POA: Insufficient documentation

## 2012-04-17 ENCOUNTER — Ambulatory Visit: Payer: Medicaid Other | Attending: Neurosurgery | Admitting: Physical Therapy

## 2012-04-17 DIAGNOSIS — M25519 Pain in unspecified shoulder: Secondary | ICD-10-CM | POA: Insufficient documentation

## 2012-04-17 DIAGNOSIS — M542 Cervicalgia: Secondary | ICD-10-CM | POA: Insufficient documentation

## 2012-04-17 DIAGNOSIS — IMO0001 Reserved for inherently not codable concepts without codable children: Secondary | ICD-10-CM | POA: Insufficient documentation

## 2012-04-24 ENCOUNTER — Ambulatory Visit: Payer: Medicaid Other | Admitting: Physical Therapy

## 2012-05-06 ENCOUNTER — Ambulatory Visit: Payer: Medicaid Other | Admitting: Physical Therapy

## 2012-09-06 ENCOUNTER — Encounter (HOSPITAL_COMMUNITY): Payer: Self-pay | Admitting: Emergency Medicine

## 2012-09-06 ENCOUNTER — Emergency Department (HOSPITAL_COMMUNITY)
Admission: EM | Admit: 2012-09-06 | Discharge: 2012-09-06 | Disposition: A | Discharge status: Home / Self Care | Payer: Medicaid Other | Attending: Emergency Medicine | Admitting: Emergency Medicine

## 2012-09-06 ENCOUNTER — Other Ambulatory Visit: Payer: Self-pay | Admitting: Neurosurgery

## 2012-09-06 ENCOUNTER — Emergency Department (HOSPITAL_COMMUNITY): Payer: Medicaid Other

## 2012-09-06 DIAGNOSIS — Z79899 Other long term (current) drug therapy: Secondary | ICD-10-CM | POA: Insufficient documentation

## 2012-09-06 DIAGNOSIS — R209 Unspecified disturbances of skin sensation: Secondary | ICD-10-CM | POA: Insufficient documentation

## 2012-09-06 DIAGNOSIS — M5000 Cervical disc disorder with myelopathy, unspecified cervical region: Secondary | ICD-10-CM | POA: Insufficient documentation

## 2012-09-06 DIAGNOSIS — F17200 Nicotine dependence, unspecified, uncomplicated: Secondary | ICD-10-CM | POA: Insufficient documentation

## 2012-09-06 DIAGNOSIS — E079 Disorder of thyroid, unspecified: Secondary | ICD-10-CM | POA: Insufficient documentation

## 2012-09-06 DIAGNOSIS — Z791 Long term (current) use of non-steroidal anti-inflammatories (NSAID): Secondary | ICD-10-CM | POA: Insufficient documentation

## 2012-09-06 DIAGNOSIS — Z8679 Personal history of other diseases of the circulatory system: Secondary | ICD-10-CM | POA: Insufficient documentation

## 2012-09-06 DIAGNOSIS — M47812 Spondylosis without myelopathy or radiculopathy, cervical region: Secondary | ICD-10-CM

## 2012-09-06 DIAGNOSIS — M542 Cervicalgia: Secondary | ICD-10-CM | POA: Insufficient documentation

## 2012-09-06 DIAGNOSIS — I10 Essential (primary) hypertension: Secondary | ICD-10-CM | POA: Insufficient documentation

## 2012-09-06 HISTORY — DX: Bipolar II disorder: F31.81

## 2012-09-06 HISTORY — DX: Post-traumatic stress disorder, unspecified: F43.10

## 2012-09-06 MED ORDER — HYDROCODONE-ACETAMINOPHEN 5-325 MG PO TABS: 1.0000 | ORAL_TABLET | ORAL | Status: DC | PRN
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
## 2012-09-06 NOTE — ED Provider Notes (Signed)
Medical screening examination/treatment/procedure(s) were performed by non-physician practitioner and as supervising physician I was immediately available for consultation/collaboration.   Lyanne Co, MD 09/06/12 [phone]

## 2012-09-06 NOTE — ED Notes (Signed)
Patient states L neck pain.

## 2012-09-16 ENCOUNTER — Ambulatory Visit
Admission: RE | Admit: 2012-09-16 | Discharge: 2012-09-16 | Disposition: A | Discharge status: Home / Self Care | Payer: Medicaid Other | Source: Ambulatory Visit | Attending: Neurosurgery | Admitting: Neurosurgery

## 2012-10-09 ENCOUNTER — Ambulatory Visit: Payer: Medicaid Other | Attending: Neurosurgery | Admitting: Physical Therapy

## 2012-10-09 DIAGNOSIS — IMO0001 Reserved for inherently not codable concepts without codable children: Secondary | ICD-10-CM | POA: Insufficient documentation

## 2012-10-09 DIAGNOSIS — M542 Cervicalgia: Secondary | ICD-10-CM | POA: Insufficient documentation

## 2012-10-09 DIAGNOSIS — M25519 Pain in unspecified shoulder: Secondary | ICD-10-CM | POA: Insufficient documentation

## 2012-10-16 ENCOUNTER — Ambulatory Visit: Payer: Medicaid Other | Admitting: Physical Therapy

## 2012-10-18 ENCOUNTER — Ambulatory Visit: Payer: Medicaid Other | Attending: Neurosurgery | Admitting: Physical Therapy

## 2012-10-18 DIAGNOSIS — M542 Cervicalgia: Secondary | ICD-10-CM | POA: Insufficient documentation

## 2012-10-18 DIAGNOSIS — M25519 Pain in unspecified shoulder: Secondary | ICD-10-CM | POA: Insufficient documentation

## 2012-10-18 DIAGNOSIS — IMO0001 Reserved for inherently not codable concepts without codable children: Secondary | ICD-10-CM | POA: Insufficient documentation

## 2012-10-23 ENCOUNTER — Ambulatory Visit: Payer: Medicaid Other | Admitting: Physical Therapy

## 2012-10-28 ENCOUNTER — Ambulatory Visit: Payer: Medicaid Other | Admitting: Physical Therapy

## 2012-10-29 ENCOUNTER — Ambulatory Visit: Payer: Medicaid Other | Admitting: Physical Therapy

## 2012-11-08 ENCOUNTER — Other Ambulatory Visit (HOSPITAL_COMMUNITY): Payer: Self-pay | Admitting: Internal Medicine

## 2012-11-08 DIAGNOSIS — Z1231 Encounter for screening mammogram for malignant neoplasm of breast: Secondary | ICD-10-CM

## 2012-11-22 ENCOUNTER — Other Ambulatory Visit: Payer: Self-pay | Admitting: Internal Medicine

## 2012-11-22 ENCOUNTER — Ambulatory Visit (HOSPITAL_COMMUNITY)
Admission: RE | Admit: 2012-11-22 | Discharge: 2012-11-22 | Disposition: A | Discharge status: Home / Self Care | Payer: Medicaid Other | Source: Ambulatory Visit | Attending: Internal Medicine | Admitting: Internal Medicine

## 2012-11-22 DIAGNOSIS — R928 Other abnormal and inconclusive findings on diagnostic imaging of breast: Secondary | ICD-10-CM

## 2012-11-22 DIAGNOSIS — Z1231 Encounter for screening mammogram for malignant neoplasm of breast: Secondary | ICD-10-CM | POA: Insufficient documentation

## 2012-12-04 ENCOUNTER — Other Ambulatory Visit: Payer: Self-pay | Admitting: Internal Medicine

## 2012-12-04 ENCOUNTER — Ambulatory Visit
Admission: RE | Admit: 2012-12-04 | Discharge: 2012-12-04 | Disposition: A | Discharge status: Home / Self Care | Payer: Medicaid Other | Source: Ambulatory Visit | Attending: Internal Medicine | Admitting: Internal Medicine

## 2012-12-04 DIAGNOSIS — R928 Other abnormal and inconclusive findings on diagnostic imaging of breast: Secondary | ICD-10-CM

## 2012-12-09 ENCOUNTER — Emergency Department (HOSPITAL_COMMUNITY): Payer: Medicaid Other

## 2012-12-09 ENCOUNTER — Encounter (HOSPITAL_COMMUNITY): Payer: Self-pay | Admitting: *Deleted

## 2012-12-09 ENCOUNTER — Emergency Department (HOSPITAL_COMMUNITY)
Admission: EM | Admit: 2012-12-09 | Discharge: 2012-12-09 | Disposition: A | Discharge status: Home / Self Care | Payer: Medicaid Other | Attending: Emergency Medicine | Admitting: Emergency Medicine

## 2012-12-09 DIAGNOSIS — R079 Chest pain, unspecified: Secondary | ICD-10-CM | POA: Insufficient documentation

## 2012-12-09 DIAGNOSIS — I10 Essential (primary) hypertension: Secondary | ICD-10-CM | POA: Insufficient documentation

## 2012-12-09 DIAGNOSIS — R05 Cough: Secondary | ICD-10-CM | POA: Insufficient documentation

## 2012-12-09 DIAGNOSIS — J069 Acute upper respiratory infection, unspecified: Secondary | ICD-10-CM

## 2012-12-09 DIAGNOSIS — F4310 Post-traumatic stress disorder, unspecified: Secondary | ICD-10-CM | POA: Insufficient documentation

## 2012-12-09 DIAGNOSIS — H9209 Otalgia, unspecified ear: Secondary | ICD-10-CM | POA: Insufficient documentation

## 2012-12-09 DIAGNOSIS — E039 Hypothyroidism, unspecified: Secondary | ICD-10-CM | POA: Insufficient documentation

## 2012-12-09 DIAGNOSIS — F3189 Other bipolar disorder: Secondary | ICD-10-CM | POA: Insufficient documentation

## 2012-12-09 DIAGNOSIS — J3489 Other specified disorders of nose and nasal sinuses: Secondary | ICD-10-CM | POA: Insufficient documentation

## 2012-12-09 DIAGNOSIS — M549 Dorsalgia, unspecified: Secondary | ICD-10-CM | POA: Insufficient documentation

## 2012-12-09 DIAGNOSIS — F17200 Nicotine dependence, unspecified, uncomplicated: Secondary | ICD-10-CM | POA: Insufficient documentation

## 2012-12-09 DIAGNOSIS — Z8679 Personal history of other diseases of the circulatory system: Secondary | ICD-10-CM | POA: Insufficient documentation

## 2012-12-09 DIAGNOSIS — Z79899 Other long term (current) drug therapy: Secondary | ICD-10-CM | POA: Insufficient documentation

## 2012-12-09 DIAGNOSIS — R059 Cough, unspecified: Secondary | ICD-10-CM | POA: Insufficient documentation

## 2012-12-09 MED ORDER — AZITHROMYCIN 250 MG PO TABS: 250.0000 mg | ORAL_TABLET | Freq: Every day | ORAL | Status: AC

## 2012-12-09 MED ORDER — LORATADINE 10 MG PO TABS: 10.0000 mg | ORAL_TABLET | Freq: Every day | ORAL | Status: AC

## 2012-12-09 NOTE — ED Notes (Signed)
Patient transported to X-ray 

## 2012-12-09 NOTE — ED Provider Notes (Signed)
History     CSN: [id number]  Arrival date & time 12/09/12  8657   First MD Initiated Contact with Patient 12/09/12 [phone]      Chief Complaint  Patient presents with  . Chest Pain  . Shortness of Breath  . Back Pain    (Consider location/radiation/quality/duration/timing/severity/associated sxs/prior treatment) Patient is a 52 y.o. female presenting with URI. The history is provided by the patient. No language interpreter was used.  URI Presenting symptoms: congestion, cough, ear pain and rhinorrhea   Presenting symptoms: no fever   Severity:  Moderate Associated symptoms comment:  Symptoms of URI with nasal and sinus congestion and pressure, runny nose, ear pain, non-productive cough and now back pain. She reports her husband has had similar symptoms for a longer duration. She denies night sweats or known fever.    Past Medical History  Diagnosis Date  . Thyroid disease     hypothyroid  . External hemorrhoids   . Hypertension   . Bipolar 2 disorder   . Post traumatic stress disorder (PTSD)     Past Surgical History  Procedure Laterality Date  . Cholecystectomy  1998  . Bunionectomy  2011  . Hemorrhoid surgery  05/12/11    external    No family history on file.  History  Substance Use Topics  . Smoking status: Current Some Day Smoker -- 0.25 packs/day    Types: Cigarettes  . Smokeless tobacco: Never Used  . Alcohol Use: Yes    OB History   Grav Para Term Preterm Abortions TAB SAB Ect Mult Living                  Review of Systems  Constitutional: Positive for chills. Negative for fever.  HENT: Positive for ear pain, congestion and rhinorrhea.   Respiratory: Positive for cough and shortness of breath.   Cardiovascular: Positive for chest pain.  Gastrointestinal: Negative for nausea and abdominal pain.  Musculoskeletal: Positive for back pain.    Allergies  Review of patient's allergies indicates no known allergies.  Home Medications   Current  Outpatient Rx  Name  Route  Sig  Dispense  Refill  . ARIPiprazole (ABILIFY) 5 MG tablet   Oral   Take 5 mg by mouth daily.         . cyclobenzaprine (FLEXERIL) 10 MG tablet   Oral   Take 20 mg by mouth 3 (three) times daily as needed for muscle spasms.         . cycloSPORINE (RESTASIS) 0.05 % ophthalmic emulsion   Both Eyes   Place 1 drop into both eyes 2 (two) times daily.         . furosemide (LASIX) 40 MG tablet   Oral   Take 40 mg by mouth daily.         Marland Kitchen levothyroxine (SYNTHROID, LEVOTHROID) 150 MCG tablet   Oral   Take 150 mcg by mouth daily.         . naproxen (NAPROSYN) 500 MG tablet   Oral   Take 500 mg by mouth daily as needed (for pain).          . potassium chloride SA (K-DUR,KLOR-CON) 20 MEQ tablet   Oral   Take 20 mEq by mouth daily.         . sertraline (ZOLOFT) 50 MG tablet   Oral   Take 50 mg by mouth daily.           BP 139/68  Pulse 64  Temp(Src) 98 F (36.7 C) (Oral)  Resp 18  SpO2 93%  LMP 11/10/2012  Physical Exam  Constitutional: She is oriented to person, place, and time. She appears well-developed and well-nourished.  HENT:  Head: Normocephalic.  Right Ear: External ear normal.  Left Ear: External ear normal.  Nose: Mucosal edema present. Right sinus exhibits frontal sinus tenderness. Left sinus exhibits frontal sinus tenderness.  Mouth/Throat: Oropharynx is clear and moist.  Neck: Normal range of motion. Neck supple.  Cardiovascular: Normal rate and normal heart sounds.   No murmur heard. Pulmonary/Chest: Effort normal and breath sounds normal. She has no wheezes. She has no rales.  Abdominal: Soft. Bowel sounds are normal. She exhibits no distension. There is no tenderness.  Musculoskeletal: Normal range of motion.  Lymphadenopathy:    She has no cervical adenopathy.  Neurological: She is alert and oriented to person, place, and time.  Skin: Skin is warm and dry. No pallor.  Psychiatric: She has a normal mood  and affect.    ED Course  Procedures (including critical care time)  Labs Reviewed - No data to display Dg Chest 2 View  12/09/2012   *RADIOLOGY REPORT*  Clinical Data: Chest pain.  Back pain.  Short of breath.  Cough and congestion.  CHEST - 2 VIEW  Comparison: 05/08/2011  Findings: Heart size is normal.  Mediastinal shadows are normal. There is chronic scarring in the right middle lobe and right lower lobe.  No sign of active infiltrate, collapse or effusion.  Chronic degenerative changes effect the spine.  IMPRESSION: Chronic scarring in the right lower lung.  No active disease evident.   Original Report Authenticated By: Paulina Fusi, M.D.     No diagnosis found.  1. URI   MDM  Suspect viral process - normal exam, negative CXR. However, husband being treated for possible pneumonia so will cover with antibiotics based on exposure.         Arnoldo Hooker, PA-C 12/09/12 [phone]

## 2012-12-09 NOTE — ED Provider Notes (Signed)
ECG shows normal sinus rhythm with a rate of 65, no ectopy. Normal axis. Normal P wave. Normal QRS. Normal intervals. Normal ST and T waves. Impression: normal ECG. When compared with ECG of 05/08/2011, no significant changes are seen.  Medical screening examination/treatment/procedure(s) were performed by non-physician practitioner and as supervising physician I was immediately available for consultation/collaboration.   Dione Booze, MD 12/09/12 [phone]

## 2012-12-09 NOTE — ED Notes (Signed)
Pt is here with chest pain, cough, back pain, and chills with sob for one week.  Productive cough

## 2012-12-10 ENCOUNTER — Ambulatory Visit
Admission: RE | Admit: 2012-12-10 | Discharge: 2012-12-10 | Disposition: A | Discharge status: Home / Self Care | Payer: Medicaid Other | Source: Ambulatory Visit | Attending: Internal Medicine | Admitting: Internal Medicine

## 2012-12-10 DIAGNOSIS — R928 Other abnormal and inconclusive findings on diagnostic imaging of breast: Secondary | ICD-10-CM

## 2013-04-02 ENCOUNTER — Other Ambulatory Visit: Payer: Self-pay | Admitting: Neurosurgery

## 2013-04-02 DIAGNOSIS — M545 Low back pain: Secondary | ICD-10-CM

## 2013-04-14 ENCOUNTER — Ambulatory Visit
Admission: RE | Admit: 2013-04-14 | Discharge: 2013-04-14 | Disposition: A | Discharge status: Home / Self Care | Payer: Medicaid Other | Source: Ambulatory Visit | Attending: Neurosurgery | Admitting: Neurosurgery

## 2013-04-14 DIAGNOSIS — M545 Low back pain: Secondary | ICD-10-CM

## 2013-10-06 ENCOUNTER — Other Ambulatory Visit: Payer: Self-pay | Admitting: Neurosurgery

## 2013-10-06 DIAGNOSIS — M545 Low back pain, unspecified: Secondary | ICD-10-CM

## 2013-10-06 DIAGNOSIS — M5430 Sciatica, unspecified side: Secondary | ICD-10-CM

## 2013-10-31 ENCOUNTER — Other Ambulatory Visit: Payer: Medicaid Other

## 2013-10-31 ENCOUNTER — Ambulatory Visit
Admission: RE | Admit: 2013-10-31 | Discharge: 2013-10-31 | Disposition: A | Discharge status: Home / Self Care | Payer: Medicaid Other | Source: Ambulatory Visit | Attending: Neurosurgery | Admitting: Neurosurgery

## 2013-10-31 VITALS — BP 79/53 | HR 71

## 2013-10-31 DIAGNOSIS — M5430 Sciatica, unspecified side: Secondary | ICD-10-CM

## 2013-10-31 DIAGNOSIS — M545 Low back pain, unspecified: Secondary | ICD-10-CM

## 2013-10-31 MED ORDER — IOHEXOL 180 MG/ML  SOLN
20.0000 mL | Freq: Once | INTRAMUSCULAR | Status: AC | PRN
  Administered 2013-10-31: 20 mL via INTRATHECAL

## 2013-10-31 MED ORDER — ONDANSETRON HCL 4 MG/2ML IJ SOLN
4.0000 mg | Freq: Once | INTRAMUSCULAR | Status: AC
  Administered 2013-10-31: 4 mg via INTRAMUSCULAR

## 2013-10-31 MED ORDER — DIAZEPAM 5 MG PO TABS
10.0000 mg | ORAL_TABLET | Freq: Once | ORAL | Status: AC
  Administered 2013-10-31: 10 mg via ORAL

## 2013-10-31 MED ORDER — MORPHINE SULFATE 4 MG/ML IJ SOLN
8.0000 mg | Freq: Once | INTRAMUSCULAR | Status: AC
  Administered 2013-10-31: 8 mg via INTRAMUSCULAR

## 2013-10-31 NOTE — Progress Notes (Signed)
Patient states she has been off Zoloft for the past two days.  She no longer takes Abilify.  Donell SievertJeanne Lohr, RN

## 2013-10-31 NOTE — Discharge Instructions (Signed)
Myelogram Discharge Instructions  1. Go home and rest quietly for the next 24 hours.  It is important to lie flat for the next 24 hours.  Get up only to go to the restroom.  You may lie in the bed or on a couch on your back, your stomach, your left side or your right side.  You may have one pillow under your head.  You may have pillows between your knees while you are on your side or under your knees while you are on your back.  2. DO NOT drive today.  Recline the seat as far back as it will go, while still wearing your seat belt, on the way home.  3. You may get up to go to the bathroom as needed.  You may sit up for 10 minutes to eat.  You may resume your normal diet and medications unless otherwise indicated.  Drink lots of extra fluids today and tomorrow.  4. The incidence of headache, nausea, or vomiting is about 5% (one in 20 patients).  If you develop a headache, lie flat and drink plenty of fluids until the headache goes away.  Caffeinated beverages may be helpful.  If you develop severe nausea and vomiting or a headache that does not go away with flat bed rest, call [phone].  5. You may resume normal activities after your 24 hours of bed rest is over; however, do not exert yourself strongly or do any heavy lifting tomorrow. If when you get up you have a headache when standing, go back to bed and force fluids for another 24 hours.  6. Call your physician for a follow-up appointment.  The results of your myelogram will be sent directly to your physician by the following day.  7. If you have any questions or if complications develop after you arrive home, please call [phone].  Discharge instructions have been explained to the patient.  The patient, or the person responsible for the patient, fully understands these instructions.      May resume Zoloft on November 01, 2013, after 9:30 am.

## 2013-11-28 ENCOUNTER — Other Ambulatory Visit: Payer: Self-pay

## 2013-11-28 DIAGNOSIS — Z1231 Encounter for screening mammogram for malignant neoplasm of breast: Secondary | ICD-10-CM

## 2013-12-19 ENCOUNTER — Encounter (INDEPENDENT_AMBULATORY_CARE_PROVIDER_SITE_OTHER): Payer: Self-pay

## 2013-12-19 ENCOUNTER — Ambulatory Visit
Admission: RE | Admit: 2013-12-19 | Discharge: 2013-12-19 | Disposition: A | Discharge status: Home / Self Care | Payer: Medicaid Other | Source: Ambulatory Visit

## 2013-12-19 DIAGNOSIS — Z1231 Encounter for screening mammogram for malignant neoplasm of breast: Secondary | ICD-10-CM

## 2014-11-23 ENCOUNTER — Other Ambulatory Visit (HOSPITAL_COMMUNITY): Payer: Self-pay | Admitting: Family Medicine

## 2014-11-23 DIAGNOSIS — Z1231 Encounter for screening mammogram for malignant neoplasm of breast: Secondary | ICD-10-CM

## 2014-12-02 ENCOUNTER — Encounter: Payer: Self-pay | Admitting: Physical Medicine & Rehabilitation

## 2014-12-25 ENCOUNTER — Ambulatory Visit (HOSPITAL_COMMUNITY)
Admission: RE | Admit: 2014-12-25 | Discharge: 2014-12-25 | Disposition: A | Discharge status: Home / Self Care | Payer: Medicaid Other | Source: Ambulatory Visit | Attending: Family Medicine | Admitting: Family Medicine

## 2014-12-25 DIAGNOSIS — Z1231 Encounter for screening mammogram for malignant neoplasm of breast: Secondary | ICD-10-CM | POA: Insufficient documentation

## 2015-01-12 ENCOUNTER — Ambulatory Visit: Payer: Medicaid Other

## 2015-01-12 ENCOUNTER — Ambulatory Visit: Payer: Medicaid Other | Admitting: Physical Medicine & Rehabilitation

## 2015-02-15 ENCOUNTER — Encounter: Payer: Self-pay | Admitting: Physical Therapy

## 2015-02-15 ENCOUNTER — Ambulatory Visit: Payer: Medicaid Other | Attending: Family Medicine | Admitting: Physical Therapy

## 2015-02-15 DIAGNOSIS — M545 Low back pain, unspecified: Secondary | ICD-10-CM

## 2015-02-15 NOTE — Therapy (Signed)
[facility] [address] [place], [place], [address] Phone: [phone]   Fax:  [phone]  Physical Therapy Evaluation  Patient Details  Name: Anna Hamilton MRN: [id number] Date of Birth: January 21, 1961 Referring Provider:  Everrett Coombe, DO  Encounter Date: 02/15/2015      PT End of Session - 02/15/15 1040    Visit Number 1   Number of Visits 1   Authorization Type medicaid   PT Start Time 0929   PT Stop Time 1015   PT Time Calculation (min) 46 min   Activity Tolerance Patient tolerated treatment well   Behavior During Therapy [facility] for tasks assessed/performed      Past Medical History  Diagnosis Date  . Thyroid disease     hypothyroid  . External hemorrhoids   . Hypertension   . Bipolar 2 disorder   . Post traumatic stress disorder (PTSD)     Past Surgical History  Procedure Laterality Date  . Cholecystectomy  1998  . Bunionectomy  2011  . Hemorrhoid surgery  05/12/11    external    There were no vitals filed for this visit.  Visit Diagnosis:  Midline low back pain without sciatica - Plan: PT plan of care cert/re-cert      Subjective Assessment - 02/15/15 0930    Subjective Patient reports that she has been having low back pain and neck pain with some knee pain.  She reports she has been having pain for about 3 years.     Limitations Sitting;Standing;Lifting;Walking;House hold activities   How long can you sit comfortably? 30 minutes   How long can you stand comfortably? 1-2 minutes   How long can you walk comfortably? 5 minutes   Diagnostic tests x-rays show DDD   Patient Stated Goals have less pain and move better   Currently in Pain? Yes   Pain Score 8    Pain Location Back  also c/o pain in the neck and in the knees   Pain Orientation Lower   Pain Descriptors / Indicators Aching;Spasm;Tightness   Pain Type Chronic pain   Pain Radiating Towards c/o some numbness in the hips   Pain Onset  More than a month ago   Pain Frequency Constant   Aggravating Factors  standing   Pain Relieving Factors leaning on cart   Effect of Pain on Daily Activities limits everything            [facility] PT Assessment - 02/15/15 0001    Assessment   Medical Diagnosis low back and neck pain   Onset Date/Surgical Date 02/14/13   Prior Therapy 3 years ago   Precautions   Precautions None   Balance Screen   Has the patient fallen in the past 6 months No   Has the patient had a decrease in activity level because of a fear of falling?  No   Is the patient reluctant to leave their home because of a fear of falling?  No   Home [employer] residence   Living Arrangements Spouse/significant other   Additional Comments stairs, some housework   Prior Function   Level of Independence Independent   Leisure would like to go to the gym   AROM   Overall AROM Comments lumbar and cervical ROM is decreased 50% with c/o pain and tightness   Strength   Overall Strength Comments 4/5   Flexibility   Soft Tissue Assessment /Muscle Length --  tight HS and piriformis   Palpation   Palpation comment very tender in the low back and in the neck    Special Tests    Special Tests --  manual lumbar distraction decreased pain                   [facility] Adult PT Treatment/Exercise - 02/15/15 0001    Lumbar Exercises: Aerobic   Tread Mill NuStep Level 5 x 5 minutes with 1 rest   Lumbar Exercises: Machines for Strengthening   Other Lumbar Machine Exercise seated row and lats 15#                PT Education - 02/15/15 1039    Education provided Yes   Education Details sheet traction, went over gym exercises she could do, weights, form and safety   Person(s) Educated Patient   Methods Explanation;Demonstration;Handout   Comprehension Verbalized understanding             PT Long Term Goals - 02/15/15 1043    PT LONG TERM GOAL #1   Title independent with  initial HEP   Time 1   Period Days   Status Achieved               Plan - 02/15/15 1040    Clinical Impression Statement Patient with long standing neck and back pain.  She is trying to be more active.  Has a weak core, decreased ROM and limited toerance to activity   Pt will benefit from skilled therapeutic intervention in order to improve on the following deficits Pain;Improper body mechanics   Rehab Potential Good   PT Frequency One time visit   PT Treatment/Interventions Therapeutic exercise;Patient/family education   PT Next Visit Plan medicaid allows only one visit a year.  She is to try the gym and water exercise   Consulted and Agree with Plan of Care Patient         Problem List There are no active problems to display for this patient.   Jearld Lesch., PT 02/15/2015, 10:45 AM  [facility]- [address] [place] [address] [place], [place], [address] Phone: [phone]   Fax:  [phone]

## 2015-06-15 ENCOUNTER — Encounter (HOSPITAL_COMMUNITY): Payer: Self-pay | Admitting: Emergency Medicine

## 2015-06-15 ENCOUNTER — Emergency Department (HOSPITAL_COMMUNITY)
Admission: EM | Admit: 2015-06-15 | Discharge: 2015-06-15 | Disposition: A | Discharge status: Home / Self Care | Payer: Medicaid Other | Attending: Emergency Medicine | Admitting: Emergency Medicine

## 2015-06-15 DIAGNOSIS — E039 Hypothyroidism, unspecified: Secondary | ICD-10-CM | POA: Insufficient documentation

## 2015-06-15 DIAGNOSIS — F4310 Post-traumatic stress disorder, unspecified: Secondary | ICD-10-CM | POA: Insufficient documentation

## 2015-06-15 DIAGNOSIS — Z79899 Other long term (current) drug therapy: Secondary | ICD-10-CM | POA: Insufficient documentation

## 2015-06-15 DIAGNOSIS — I10 Essential (primary) hypertension: Secondary | ICD-10-CM | POA: Insufficient documentation

## 2015-06-15 DIAGNOSIS — M5442 Lumbago with sciatica, left side: Secondary | ICD-10-CM | POA: Insufficient documentation

## 2015-06-15 DIAGNOSIS — Z8719 Personal history of other diseases of the digestive system: Secondary | ICD-10-CM | POA: Insufficient documentation

## 2015-06-15 DIAGNOSIS — M5441 Lumbago with sciatica, right side: Secondary | ICD-10-CM | POA: Insufficient documentation

## 2015-06-15 DIAGNOSIS — E669 Obesity, unspecified: Secondary | ICD-10-CM | POA: Insufficient documentation

## 2015-06-15 DIAGNOSIS — F17210 Nicotine dependence, cigarettes, uncomplicated: Secondary | ICD-10-CM | POA: Insufficient documentation

## 2015-06-15 DIAGNOSIS — F419 Anxiety disorder, unspecified: Secondary | ICD-10-CM | POA: Insufficient documentation

## 2015-06-15 DIAGNOSIS — Z3202 Encounter for pregnancy test, result negative: Secondary | ICD-10-CM | POA: Insufficient documentation

## 2015-06-15 DIAGNOSIS — F3181 Bipolar II disorder: Secondary | ICD-10-CM | POA: Insufficient documentation

## 2015-06-15 DIAGNOSIS — G8929 Other chronic pain: Secondary | ICD-10-CM

## 2015-06-15 LAB — URINALYSIS, ROUTINE W REFLEX MICROSCOPIC
Bilirubin Urine: NEGATIVE
Glucose, UA: NEGATIVE mg/dL
Hgb urine dipstick: NEGATIVE
KETONES UR: NEGATIVE mg/dL
LEUKOCYTES UA: NEGATIVE
NITRITE: NEGATIVE
PH: 5.5 (ref 5.0–8.0)
Protein, ur: NEGATIVE mg/dL
Specific Gravity, Urine: 1.015 (ref 1.005–1.030)

## 2015-06-15 LAB — PREGNANCY, URINE: Preg Test, Ur: NEGATIVE

## 2015-06-15 MED ORDER — KETOROLAC TROMETHAMINE 30 MG/ML IJ SOLN
30.0000 mg | Freq: Once | INTRAMUSCULAR | Status: AC
  Administered 2015-06-15: 30 mg via INTRAMUSCULAR
  Filled 2015-06-15: qty 1

## 2015-06-15 MED ORDER — OXYCODONE-ACETAMINOPHEN 5-325 MG PO TABS
ORAL_TABLET | ORAL | Status: AC
  Filled 2015-06-15: qty 1

## 2015-06-15 MED ORDER — OXYCODONE-ACETAMINOPHEN 5-325 MG PO TABS
1.0000 | ORAL_TABLET | Freq: Once | ORAL | Status: AC
  Administered 2015-06-15: 1 via ORAL

## 2015-06-15 NOTE — ED Notes (Signed)
Pt states for the last month she has been having shooting pains into her legs that feeling like a burning sensations. Pt also reports chronic back pain. Pt states she feels like the pain in her legs is coming from her back. Pt denies any numbness in legs. Pt states two days ago she got up and the pain was so bad she urinated on her self. No loss of bowel of bladder since.

## 2015-06-15 NOTE — ED Provider Notes (Signed)
CSN: [id number]     Arrival date & time 06/15/15  1241 History   First MD Initiated Contact with Patient 06/15/15 1653     Chief Complaint  Patient presents with  . Leg Pain     (Consider location/radiation/quality/duration/timing/severity/associated sxs/prior Treatment) HPI   54 yo lady with HTN, bipolar and PTSD, hypothyroidism, chronic pain, who is here for burning pain in both of her thighs since this morning. She has had chronic back pain for years and has had extensive workup done. She has also seen a neurosurgeon Dr. Wynetta Emeryram in the past.  She said her she is having shooting pain in the leg, but denies any numbness or b/b incontinence, no night time pain, no weight changes or other red flag symptoms. She says she is having the burning pain for a month now and she is not able to lie down or stand up, or sleep at night, and she is in constant pain all the time and she feels like there is lava in her thighs .  Pt goes to [facility] and obtains her prescription from there, and also latuda and Wellbutrin for her bipolar. She also [facility].  Recently, she has been started on benicar for her hypertension.   Imaging done last year included a CT of lumbar with contrast and myelogram. The CT lumbar showed spinal stenosis at L4-L5 and facet arthropathy. The myelogram also showed degenerative disc disease at L2-l3 without any compromise.    Past Medical History  Diagnosis Date  . Thyroid disease     hypothyroid  . External hemorrhoids   . Hypertension   . Bipolar 2 disorder (HCC)   . Post traumatic stress disorder (PTSD)    Past Surgical History  Procedure Laterality Date  . Cholecystectomy  1998  . Bunionectomy  2011  . Hemorrhoid surgery  05/12/11    external   No family history on file. Social History  Substance Use Topics  . Smoking status: Current Some Day Smoker -- 0.25 packs/day    Types: Cigarettes  . Smokeless tobacco: Never Used  .  Alcohol Use: Yes   OB History    No data available     Review of Systems  Constitutional: Negative for fever, activity change, appetite change, fatigue and unexpected weight change.  Respiratory: Negative for cough and shortness of breath.   Cardiovascular: Negative for chest pain and leg swelling.  Gastrointestinal: Negative for nausea, vomiting, abdominal pain and diarrhea.  Neurological: Negative for dizziness, weakness, numbness and headaches.  Psychiatric/Behavioral: Negative for suicidal ideas and sleep disturbance. The patient is nervous/anxious.       Allergies  Lamotrigine  Home Medications   Prior to Admission medications   Medication Sig Start Date End Date Taking? Authorizing Provider  ARIPiprazole (ABILIFY) 5 MG tablet Take 5 mg by mouth daily.    Historical Provider, MD  azithromycin (ZITHROMAX) 250 MG tablet Take 1 tablet (250 mg total) by mouth daily. Take first 2 tablets together, then 1 every day until finished. 12/09/12   Elpidio AnisShari Upstill, PA-C  cyclobenzaprine (FLEXERIL) 10 MG tablet Take 20 mg by mouth 3 (three) times daily as needed for muscle spasms.    Historical Provider, MD  cycloSPORINE (RESTASIS) 0.05 % ophthalmic emulsion Place 1 drop into both eyes 2 (two) times daily.    Historical Provider, MD  furosemide (LASIX) 40 MG tablet Take 40 mg by mouth daily.    Historical Provider, MD  levothyroxine (SYNTHROID,  LEVOTHROID) 150 MCG tablet Take 150 mcg by mouth daily.    Historical Provider, MD  loratadine (CLARITIN) 10 MG tablet Take 1 tablet (10 mg total) by mouth daily. 12/09/12   Elpidio Anis, PA-C  naproxen (NAPROSYN) 500 MG tablet Take 500 mg by mouth daily as needed (for pain).     Historical Provider, MD  potassium chloride SA (K-DUR,KLOR-CON) 20 MEQ tablet Take 20 mEq by mouth daily.    Historical Provider, MD  sertraline (ZOLOFT) 50 MG tablet Take 50 mg by mouth daily.    Historical Provider, MD   BP 157/77 mmHg  Pulse 56  Temp(Src) 98.2 F (36.8 C)  (Oral)  Resp 18  Wt 147.532 kg  SpO2 98%  LMP 04/15/2015 Physical Exam  Constitutional: She is oriented to person, place, and time. She appears well-developed and well-nourished.  obese  HENT:  Head: Normocephalic and atraumatic.  Neck: Normal range of motion. Neck supple.  Cardiovascular: Normal rate, regular rhythm and normal heart sounds.   No murmur heard. Abdominal: Soft. Bowel sounds are normal. She exhibits no distension. There is no tenderness.  Genitourinary:  Rectal exam shows normal tone and normal sensation   Musculoskeletal: Normal range of motion.  St leg test positive on both  Neuro exam entirely normal: Normal sensation and motor strength in all extremities  Neurological: She is alert and oriented to person, place, and time. No cranial nerve deficit. Coordination normal.    ED Course  Procedures (including critical care time) Labs Review Labs Reviewed  BASIC METABOLIC PANEL    Imaging Review No results found. I have personally reviewed and evaluated these images and lab results as part of my medical decision-making.   EKG Interpretation None      MDM   Final diagnoses:  None   Pt with chronic back pain, here for 1 month history of burning sensation and pain down both of her thighs and exam consistent with sciatica like pain. No red flag symptoms, and no cauda equina as pt denies b/b incontinence. She goes to a pain clinic and obtains her medicines there for her pain and bipolar disorder. Ct lumbar done last year showed l4-l5 spinal stenosis and she also had a myelogram done.   Upon later questioning, pt does mention that she has been leaking a little bit of urine. So we will obtain a UA, and then a post-void bladder scan  Physical exam was consistent with sciatica like pain as her straight leg test was positive. Also, her rectal exam was normal sensation and normal tone, so she does not have cauda equina syndrome. Other aspects of the neurological  exam was normal.     We have given her an injection of toradol.  Will ask pt to follow up with neurosurgery where she has been seen before-       Deneise Lever, MD 06/15/15 1806  Deneise Lever, MD 06/15/15 5621  Zadie Rhine, MD 06/16/15 [phone]

## 2015-06-15 NOTE — Discharge Instructions (Signed)
Please follow up with your PCP, and your neurosurgeon Dr. Wynetta Emeryram for management of your back pain.

## 2015-06-15 NOTE — ED Provider Notes (Signed)
Patient seen/examined in the Emergency Department in conjunction with Resident Physician Provider  Patient reports back pain and bilateral leg burning for over a month . She reports some recent urinary incontinence but admits she feels bladder is full and has urge to urinate No fecal incontinence Bladder scan around 30 ml post void Exam : awake/alert equal distal motor 5/5 strength noted with the following: hip flexion/knee flexion/extension, ankle dorsi/plantar flexion, great toe extension intact bilaterally, Equal patellar/achilles reflex noted (2+) in bilateral lower extremities.  Pt is able to ambulate unassisted. Plan: will check urinalysis.  Resident will check rectal tone.  No focal neuro deficits I doubt acute cauda equina syndrome.     Zadie Rhineonald Ruffin Lada, MD 06/15/15 1800

## 2016-06-03 LAB — GLUCOSE, POCT (MANUAL RESULT ENTRY): POC GLUCOSE: 104 mg/dL — AB (ref 70–99)

## 2016-07-10 ENCOUNTER — Emergency Department (HOSPITAL_COMMUNITY): Payer: Medicaid Other

## 2016-07-10 ENCOUNTER — Encounter (HOSPITAL_COMMUNITY): Payer: Self-pay | Admitting: Emergency Medicine

## 2016-07-10 ENCOUNTER — Emergency Department (HOSPITAL_COMMUNITY)
Admission: EM | Admit: 2016-07-10 | Discharge: 2016-07-10 | Disposition: A | Discharge status: Home / Self Care | Payer: Medicaid Other | Attending: Emergency Medicine | Admitting: Emergency Medicine

## 2016-07-10 DIAGNOSIS — F17210 Nicotine dependence, cigarettes, uncomplicated: Secondary | ICD-10-CM | POA: Insufficient documentation

## 2016-07-10 DIAGNOSIS — I10 Essential (primary) hypertension: Secondary | ICD-10-CM | POA: Insufficient documentation

## 2016-07-10 DIAGNOSIS — Z79899 Other long term (current) drug therapy: Secondary | ICD-10-CM | POA: Insufficient documentation

## 2016-07-10 DIAGNOSIS — J40 Bronchitis, not specified as acute or chronic: Secondary | ICD-10-CM

## 2016-07-10 DIAGNOSIS — E039 Hypothyroidism, unspecified: Secondary | ICD-10-CM | POA: Insufficient documentation

## 2016-07-10 LAB — BASIC METABOLIC PANEL
Anion gap: 9 (ref 5–15)
BUN: 6 mg/dL (ref 6–20)
CALCIUM: 9.1 mg/dL (ref 8.9–10.3)
CO2: 27 mmol/L (ref 22–32)
CREATININE: 0.73 mg/dL (ref 0.44–1.00)
Chloride: 102 mmol/L (ref 101–111)
GFR calc non Af Amer: >60 mL/min (ref >60)
Glucose, Bld: 91 mg/dL (ref 65–99)
Potassium: 4.1 mmol/L (ref 3.5–5.1)
SODIUM: 138 mmol/L (ref 135–145)

## 2016-07-10 LAB — CBC WITH DIFFERENTIAL/PLATELET
BASOS PCT: 2 %
Basophils Absolute: 0.1 10*3/uL (ref 0.0–0.1)
EOS ABS: 0.2 10*3/uL (ref 0.0–0.7)
Eosinophils Relative: 3 %
HCT: 39.1 % (ref 36.0–46.0)
Hemoglobin: 13.3 g/dL (ref 12.0–15.0)
Lymphocytes Relative: 45 %
Lymphs Abs: 2.4 10*3/uL (ref 0.7–4.0)
MCH: 31.6 pg (ref 26.0–34.0)
MCHC: 34 g/dL (ref 30.0–36.0)
MCV: 92.9 fL (ref 78.0–100.0)
MONO ABS: 0.4 10*3/uL (ref 0.1–1.0)
MONOS PCT: 7 %
Neutro Abs: 2.3 10*3/uL (ref 1.7–7.7)
Neutrophils Relative %: 43 %
Platelets: 227 10*3/uL (ref 150–400)
RBC: 4.21 MIL/uL (ref 3.87–5.11)
RDW: 13.7 % (ref 11.5–15.5)
WBC: 5.3 10*3/uL (ref 4.0–10.5)

## 2016-07-10 LAB — I-STAT TROPONIN, ED: Troponin i, poc: 0 ng/mL (ref 0.00–0.08)

## 2016-07-10 MED ORDER — BENZONATATE 100 MG PO CAPS: 100.0000 mg | ORAL_CAPSULE | Freq: Three times a day (TID) | ORAL | 0 refills | Status: AC | PRN

## 2016-07-10 MED ORDER — BENZONATATE 100 MG PO CAPS
200.0000 mg | ORAL_CAPSULE | Freq: Once | ORAL | Status: AC
  Administered 2016-07-10: 200 mg via ORAL
  Filled 2016-07-10: qty 2

## 2016-07-10 MED ORDER — IPRATROPIUM BROMIDE 0.02 % IN SOLN
0.5000 mg | Freq: Once | RESPIRATORY_TRACT | Status: AC
  Administered 2016-07-10: 0.5 mg via RESPIRATORY_TRACT
  Filled 2016-07-10: qty 2.5

## 2016-07-10 MED ORDER — KETOROLAC TROMETHAMINE 30 MG/ML IJ SOLN
15.0000 mg | Freq: Once | INTRAMUSCULAR | Status: AC
  Administered 2016-07-10: 15 mg via INTRAVENOUS
  Filled 2016-07-10: qty 1

## 2016-07-10 MED ORDER — FLUTICASONE PROPIONATE 50 MCG/ACT NA SUSP: 2.0000 | Freq: Every day | NASAL | 0 refills | Status: AC

## 2016-07-10 MED ORDER — ALBUTEROL SULFATE (2.5 MG/3ML) 0.083% IN NEBU
5.0000 mg | INHALATION_SOLUTION | Freq: Once | RESPIRATORY_TRACT | Status: AC
  Administered 2016-07-10: 5 mg via RESPIRATORY_TRACT
  Filled 2016-07-10: qty 6

## 2016-07-10 NOTE — ED Provider Notes (Signed)
Patient care assumed from Anna HakeNicole Nadeau, PA-C at shift change. Please see her note for further.  Briefly, the patient presented with cough, wheezing and shortness of breath with associated rhinorrhea. Plan at shift change was to reevaluate after breathing treatment and for discharge of patient was feeling better. At my evaluation patient reports she is feeling much better and ready for discharge. No wheezing on exam. She no longer feels short of breath. I offered the patient steroids and patient declined. I also offered all be drawn inhaler and patient declined. She tells me she has albuterol at home. Patient would like Tessalon Perles and Flonase for her runny nose. I discussed strict and specific return precautions. I advised the patient to follow-up with their primary care provider this week. I advised the patient to return to the emergency department with new or worsening symptoms or new concerns. The patient verbalized understanding and agreement with plan.    Vitals:   07/10/16 1258 07/10/16 1315 07/10/16 1525  BP: 149/82 124/80 106/64  Pulse: 68 63 (!) 59  Resp: 20 21 11   Temp: 98.5 F (36.9 C)  97.8 F (36.6 C)  TempSrc: Oral  Oral  SpO2: 95% 99% 95%    Bronchitis     Everlene FarrierWilliam Chritopher Coster, PA-C 07/10/16 1653    Linwood DibblesJon Knapp, MD 07/13/16 1012

## 2016-07-10 NOTE — ED Triage Notes (Signed)
Pt states chest pain is from coughing so much

## 2016-07-10 NOTE — ED Triage Notes (Signed)
Pt c/o cough x1 week, chest pain, back pain, sob.

## 2016-07-10 NOTE — ED Notes (Signed)
papers reviewed, medications reviewed, IV removed. Patient sts she feels better

## 2016-07-10 NOTE — ED Provider Notes (Signed)
[facility]-EMERGENCY DEPT Provider Note   CSN: [id number] Arrival date & time: 07/10/16  1252     History   Chief Complaint Chief Complaint  Patient presents with  . Shortness of Breath    HPI Anna Hamilton is a [age] y.o. female.  HPI   Patient is a 55 year old female with history of hypothyroidism, hypertension and bipolar disorder who presents the ED with complaint of worsening cough, onset one week. Patient reports over the past week she has had worsening productive cough (yellow sputum), nasal congestion, rhinorrhea, sore throat, sinus pressure, wheezing and shortness of breath. Patient reports she also began having burning midsternal chest pain yesterday evening while she was sitting at home. Patient reports the chest pain has remained constant throughout the day, denies radiation. Patient reports chest pain is worse with movement or coughing. Patient states she has been taking over-the-counter cough syrup and cough drops without relief of symptoms. She notes she has been around kids with a cold over the past couple weeks. This is denies any recent use of antibiotics or recent hospitalizations. Denies fever, chills, headache, hemoptysis, palpitations, abdominal pain, vomiting, rash. Patient denies any personal or family history of cardiac disease. Patient denies smoking but notes she is around others that smoke.  Past Medical History:  Diagnosis Date  . Bipolar 2 disorder (HCC)   . External hemorrhoids   . Hypertension   . Post traumatic stress disorder (PTSD)   . Thyroid disease    hypothyroid    There are no active problems to display for this patient.   Past Surgical History:  Procedure Laterality Date  . BUNIONECTOMY  2011  . CHOLECYSTECTOMY  1998  . HEMORRHOID SURGERY  05/12/11   external    OB History    No data available       Home Medications    Prior to Admission medications   Medication Sig Start Date End Date Taking? Authorizing Provider  ARIPiprazole  (ABILIFY) 5 MG tablet Take 5 mg by mouth daily.    Historical Provider, MD  azithromycin (ZITHROMAX) 250 MG tablet Take 1 tablet (250 mg total) by mouth daily. Take first 2 tablets together, then 1 every day until finished. 12/09/12   Elpidio AnisShari Upstill, PA-C  benzonatate (TESSALON) 100 MG capsule Take 1 capsule (100 mg total) by mouth 3 (three) times daily as needed for cough. 07/10/16   Everlene FarrierWilliam Dansie, PA-C  cyclobenzaprine (FLEXERIL) 10 MG tablet Take 20 mg by mouth 3 (three) times daily as needed for muscle spasms.    Historical Provider, MD  cycloSPORINE (RESTASIS) 0.05 % ophthalmic emulsion Place 1 drop into both eyes 2 (two) times daily.    Historical Provider, MD  fluticasone (FLONASE) 50 MCG/ACT nasal spray Place 2 sprays into both nostrils daily. 07/10/16   Everlene FarrierWilliam Dansie, PA-C  furosemide (LASIX) 40 MG tablet Take 40 mg by mouth daily.    Historical Provider, MD  levothyroxine (SYNTHROID, LEVOTHROID) 150 MCG tablet Take 150 mcg by mouth daily.    Historical Provider, MD  loratadine (CLARITIN) 10 MG tablet Take 1 tablet (10 mg total) by mouth daily. 12/09/12   Elpidio AnisShari Upstill, PA-C  naproxen (NAPROSYN) 500 MG tablet Take 500 mg by mouth daily as needed (for pain).     Historical Provider, MD  potassium chloride SA (K-DUR,KLOR-CON) 20 MEQ tablet Take 20 mEq by mouth daily.    Historical Provider, MD  sertraline (ZOLOFT) 50 MG tablet Take 50 mg by mouth daily.    Historical Provider, MD  Family History No family history on file.  Social History Social History  Substance Use Topics  . Smoking status: Current Some Day Smoker    Packs/day: 0.25    Types: Cigarettes  . Smokeless tobacco: Never Used  . Alcohol use Yes     Allergies   Lamotrigine   Review of Systems Review of Systems  HENT: Positive for congestion, rhinorrhea, sinus pressure and sore throat.   Respiratory: Positive for cough, shortness of breath and wheezing.   Cardiovascular: Positive for chest pain.  All other  systems reviewed and are negative.    Physical Exam Updated Vital Signs BP 104/73   Pulse 64   Temp 97.8 F (36.6 C) (Oral)   Resp 23   LMP  (LMP Unknown)   SpO2 95%   Physical Exam  Constitutional: She is oriented to person, place, and time. She appears well-developed and well-nourished. No distress.  HENT:  Head: Normocephalic and atraumatic.  Right Ear: Tympanic membrane normal.  Nose: Nose normal. Right sinus exhibits no maxillary sinus tenderness and no frontal sinus tenderness. Left sinus exhibits no maxillary sinus tenderness and no frontal sinus tenderness.  Mouth/Throat: Uvula is midline, oropharynx is clear and moist and mucous membranes are normal. No oropharyngeal exudate, posterior oropharyngeal edema, posterior oropharyngeal erythema or tonsillar abscesses. No tonsillar exudate.  Eyes: Conjunctivae and EOM are normal. Right eye exhibits no discharge. Left eye exhibits no discharge. No scleral icterus.  Neck: Normal range of motion. Neck supple.  Cardiovascular: Normal rate, regular rhythm, normal heart sounds and intact distal pulses.   Pulmonary/Chest: Effort normal. No respiratory distress. She has wheezes (expiratory wheezes noted, worse in lower lobes). She has no rales. She exhibits tenderness (TTP over midsternal chest wall). She exhibits no laceration, no crepitus, no edema, no deformity, no swelling and no retraction.  Abdominal: Soft. Bowel sounds are normal. She exhibits no distension and no mass. There is no tenderness. There is no rebound and no guarding. No hernia.  Musculoskeletal: Normal range of motion. She exhibits no edema.  Neurological: She is alert and oriented to person, place, and time.  Skin: Skin is warm and dry. She is not diaphoretic.  Nursing note and vitals reviewed.    ED Treatments / Results  Labs (all labs ordered are listed, but only abnormal results are displayed) Labs Reviewed  CBC WITH DIFFERENTIAL/PLATELET  BASIC METABOLIC PANEL   I-STAT TROPOININ, ED    EKG  EKG Interpretation None       Radiology Dg Chest 2 View  Result Date: 07/10/2016 CLINICAL DATA:  Productive cough for 1 week. Chest pain and back pain. EXAM: CHEST  2 VIEW COMPARISON:  12/09/2012 FINDINGS: Mild cardiomegaly. Low volumes. Bibasilar atelectasis. Vascular congestion. No sign of interstitial edema. Stable thoracic spine. IMPRESSION: Cardiomegaly and vascular congestion.  Bibasilar atelectasis. Electronically Signed   By: Jolaine ClickArthur  Hoss M.D.   On: 07/10/2016 13:28    Procedures Procedures (including critical care time)  Medications Ordered in ED Medications  albuterol (PROVENTIL) (2.5 MG/3ML) 0.083% nebulizer solution 5 mg (5 mg Nebulization Given 07/10/16 1336)  ketorolac (TORADOL) 30 MG/ML injection 15 mg (15 mg Intravenous Given 07/10/16 1336)  albuterol (PROVENTIL) (2.5 MG/3ML) 0.083% nebulizer solution 5 mg (5 mg Nebulization Given 07/10/16 1524)  ipratropium (ATROVENT) nebulizer solution 0.5 mg (0.5 mg Nebulization Given 07/10/16 1524)  benzonatate (TESSALON) capsule 200 mg (200 mg Oral Given 07/10/16 1654)     Initial Impression / Assessment and Plan / ED Course  I have reviewed  the triage vital signs and the nursing notes.  Pertinent labs & imaging results that were available during my care of the patient were reviewed by me and considered in my medical decision making (see chart for details).  Clinical Course     Patient presents with cough, wheezing and shortness of breath with associated rhinorrhea. Also reports having constant CP that started yesterday, worse with movement or coughing. VSS. Exam revealed expiratory wheezes bilaterally, worse in lower lobes, patient without signs of respiratory distress or increased work of breathing. Tenderness to palpation over midsternal chest wall. Remaining exam unremarkable. Patient given albuterol neb treatment and Toradol.  EKG showed sinus rhythm with no acute ischemic changes. Chest  x-ray showed cardiomegaly and vascular congestion with bibasilar atelectasis, otherwise unremarkable. Troponin negative. Labs unremarkable. On reevaluation patient reports her breathing has mildly improved after her first neb treatment. Reexamination revealed slight improvement in wheezing throughout. Will order a DuoNeb treatment for patient. Suspect pt's sxs are likely due to viral bronchitis. I have a low suspicion for ACS, PE, dissection, or other acute cardiac event at this time.   Hand-off to Will Dansie, PA-C. Plan to reevaluate pt s/p neb tx. plan to discharge patient home with symptomatically treatment an PCP follow-up.  Final Clinical Impressions(s) / ED Diagnoses   Final diagnoses:  Bronchitis    New Prescriptions Discharge Medication List as of 07/10/2016  4:51 PM    START taking these medications   Details  benzonatate (TESSALON) 100 MG capsule Take 1 capsule (100 mg total) by mouth 3 (three) times daily as needed for cough., Starting Mon 07/10/2016, Print    fluticasone (FLONASE) 50 MCG/ACT nasal spray Place 2 sprays into both nostrils daily., Starting Mon 07/10/2016, Print         Satira Sark [place], [place] 07/11/16 4540    Linwood Dibbles, MD 07/13/16 [phone]

## 2017-06-18 ENCOUNTER — Emergency Department (HOSPITAL_COMMUNITY): Payer: Medicaid Other

## 2017-06-18 ENCOUNTER — Encounter (HOSPITAL_COMMUNITY): Payer: Self-pay | Admitting: Emergency Medicine

## 2017-06-18 DIAGNOSIS — R05 Cough: Secondary | ICD-10-CM | POA: Insufficient documentation

## 2017-06-18 DIAGNOSIS — Z5321 Procedure and treatment not carried out due to patient leaving prior to being seen by health care provider: Secondary | ICD-10-CM | POA: Diagnosis not present

## 2017-06-18 NOTE — ED Triage Notes (Signed)
Patient reports persistent dry cough for 2 weeks with malaise/fatigue and body aches , denies fever or chills .

## 2017-06-19 ENCOUNTER — Emergency Department (HOSPITAL_COMMUNITY)
Admission: EM | Admit: 2017-06-19 | Discharge: 2017-06-19 | Disposition: A | Discharge status: Home / Self Care | Payer: Medicaid Other | Attending: Emergency Medicine | Admitting: Emergency Medicine

## 2017-06-19 HISTORY — DX: Obesity, unspecified: E66.9

## 2017-06-19 HISTORY — DX: Schizoaffective disorder, bipolar type: F25.0

## 2017-06-19 HISTORY — DX: Schizoaffective disorder, unspecified: F25.9

## 2017-06-19 NOTE — ED Notes (Addendum)
No answer for vitals  

## 2017-06-19 NOTE — ED Notes (Signed)
No answer for vitals x2 

## 2017-09-07 ENCOUNTER — Emergency Department (HOSPITAL_COMMUNITY)
Admission: EM | Admit: 2017-09-07 | Discharge: 2017-09-07 | Disposition: A | Discharge status: Home / Self Care | Payer: Medicaid Other | Attending: Emergency Medicine | Admitting: Emergency Medicine

## 2017-09-07 ENCOUNTER — Other Ambulatory Visit: Payer: Self-pay

## 2017-09-07 ENCOUNTER — Encounter (HOSPITAL_COMMUNITY): Payer: Self-pay

## 2017-09-07 DIAGNOSIS — Z5321 Procedure and treatment not carried out due to patient leaving prior to being seen by health care provider: Secondary | ICD-10-CM | POA: Insufficient documentation

## 2017-09-07 DIAGNOSIS — R1031 Right lower quadrant pain: Secondary | ICD-10-CM | POA: Insufficient documentation

## 2017-09-07 LAB — COMPREHENSIVE METABOLIC PANEL
ALT: 24 U/L (ref 14–54)
AST: 28 U/L (ref 15–41)
Albumin: 3.9 g/dL (ref 3.5–5.0)
Alkaline Phosphatase: 71 U/L (ref 38–126)
Anion gap: 10 (ref 5–15)
BUN: 9 mg/dL (ref 6–20)
CHLORIDE: 101 mmol/L (ref 101–111)
CO2: 27 mmol/L (ref 22–32)
CREATININE: 0.67 mg/dL (ref 0.44–1.00)
Calcium: 9.2 mg/dL (ref 8.9–10.3)
Glucose, Bld: 95 mg/dL (ref 65–99)
POTASSIUM: 4.1 mmol/L (ref 3.5–5.1)
SODIUM: 138 mmol/L (ref 135–145)
Total Bilirubin: 0.4 mg/dL (ref 0.3–1.2)
Total Protein: 7.1 g/dL (ref 6.5–8.1)

## 2017-09-07 LAB — URINALYSIS, ROUTINE W REFLEX MICROSCOPIC
Bilirubin Urine: NEGATIVE
GLUCOSE, UA: NEGATIVE mg/dL
HGB URINE DIPSTICK: NEGATIVE
Ketones, ur: NEGATIVE mg/dL
LEUKOCYTES UA: NEGATIVE
Nitrite: NEGATIVE
PH: 6 (ref 5.0–8.0)
PROTEIN: NEGATIVE mg/dL
SPECIFIC GRAVITY, URINE: 1.017 (ref 1.005–1.030)

## 2017-09-07 LAB — CBC
HEMATOCRIT: 38.5 % (ref 36.0–46.0)
HEMOGLOBIN: 12.7 g/dL (ref 12.0–15.0)
MCH: 30.7 pg (ref 26.0–34.0)
MCHC: 33 g/dL (ref 30.0–36.0)
MCV: 93 fL (ref 78.0–100.0)
PLATELETS: 295 10*3/uL (ref 150–400)
RBC: 4.14 MIL/uL (ref 3.87–5.11)
RDW: 13.6 % (ref 11.5–15.5)
WBC: 6.6 10*3/uL (ref 4.0–10.5)

## 2017-09-07 LAB — I-STAT BETA HCG BLOOD, ED (MC, WL, AP ONLY): I-stat hCG, quantitative: <5 m[IU]/mL (ref <5)

## 2017-09-07 LAB — LIPASE, BLOOD: LIPASE: 20 U/L (ref 11–51)

## 2017-09-07 NOTE — ED Notes (Addendum)
Pt states she is leaving due to wait.  Encouraged her to stay and she declined.  States she may return in the morning.

## 2017-09-07 NOTE — ED Triage Notes (Addendum)
Pt presents to the ed with complaints of right lower abdominal pain, x 2 weeks. t. She had a colonoscopy x 3 weeks ago and thought it was gas. Normal BM two days ago.

## 2017-11-12 IMAGING — DX DG CHEST 2V
2 series · 2 of 2 positions shown · non-contrast
Comparison: 12/09/2012

CLINICAL DATA: Productive cough for 1 week. Chest pain and back
pain.

EXAM:
CHEST  2 VIEW

[chest pa]
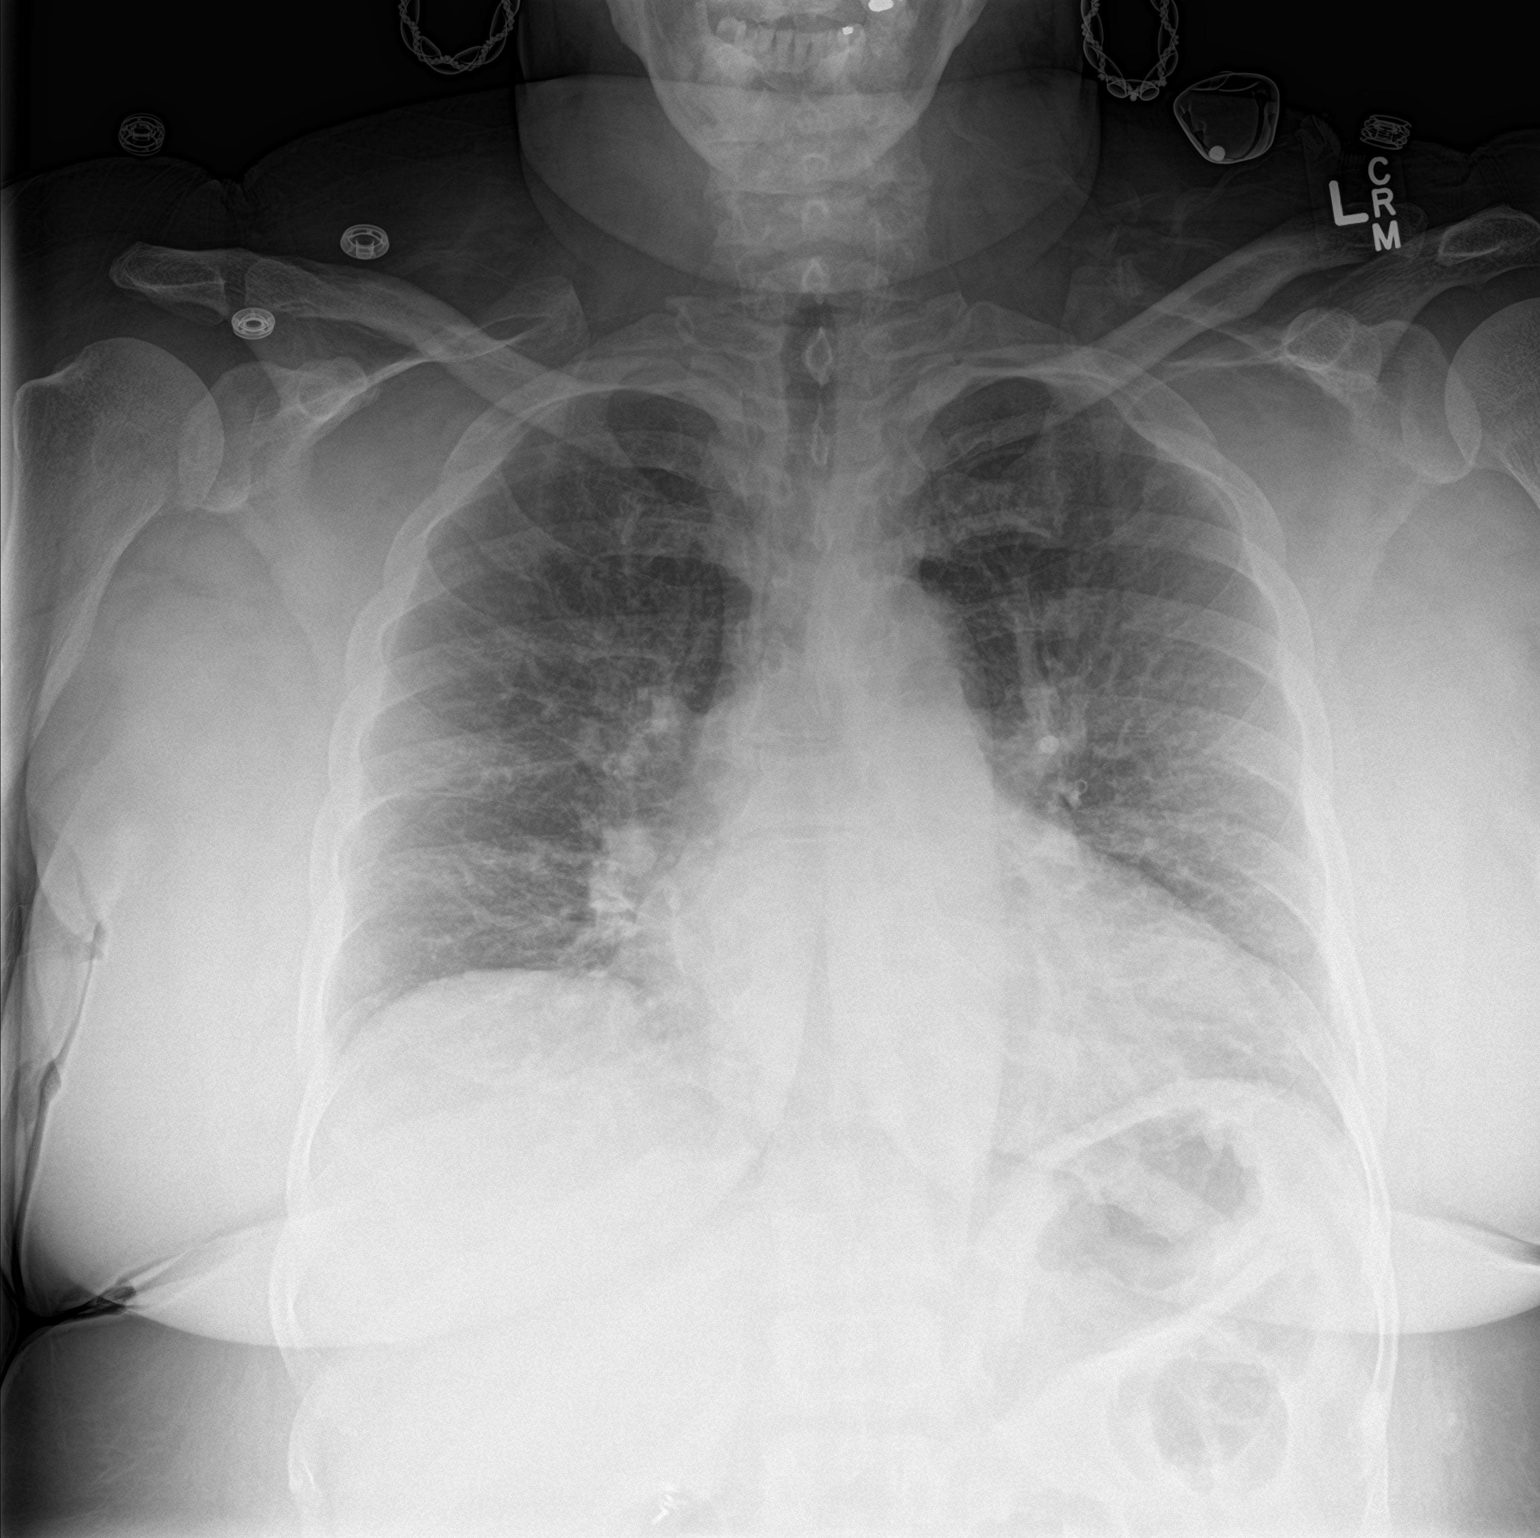

[chest lat]
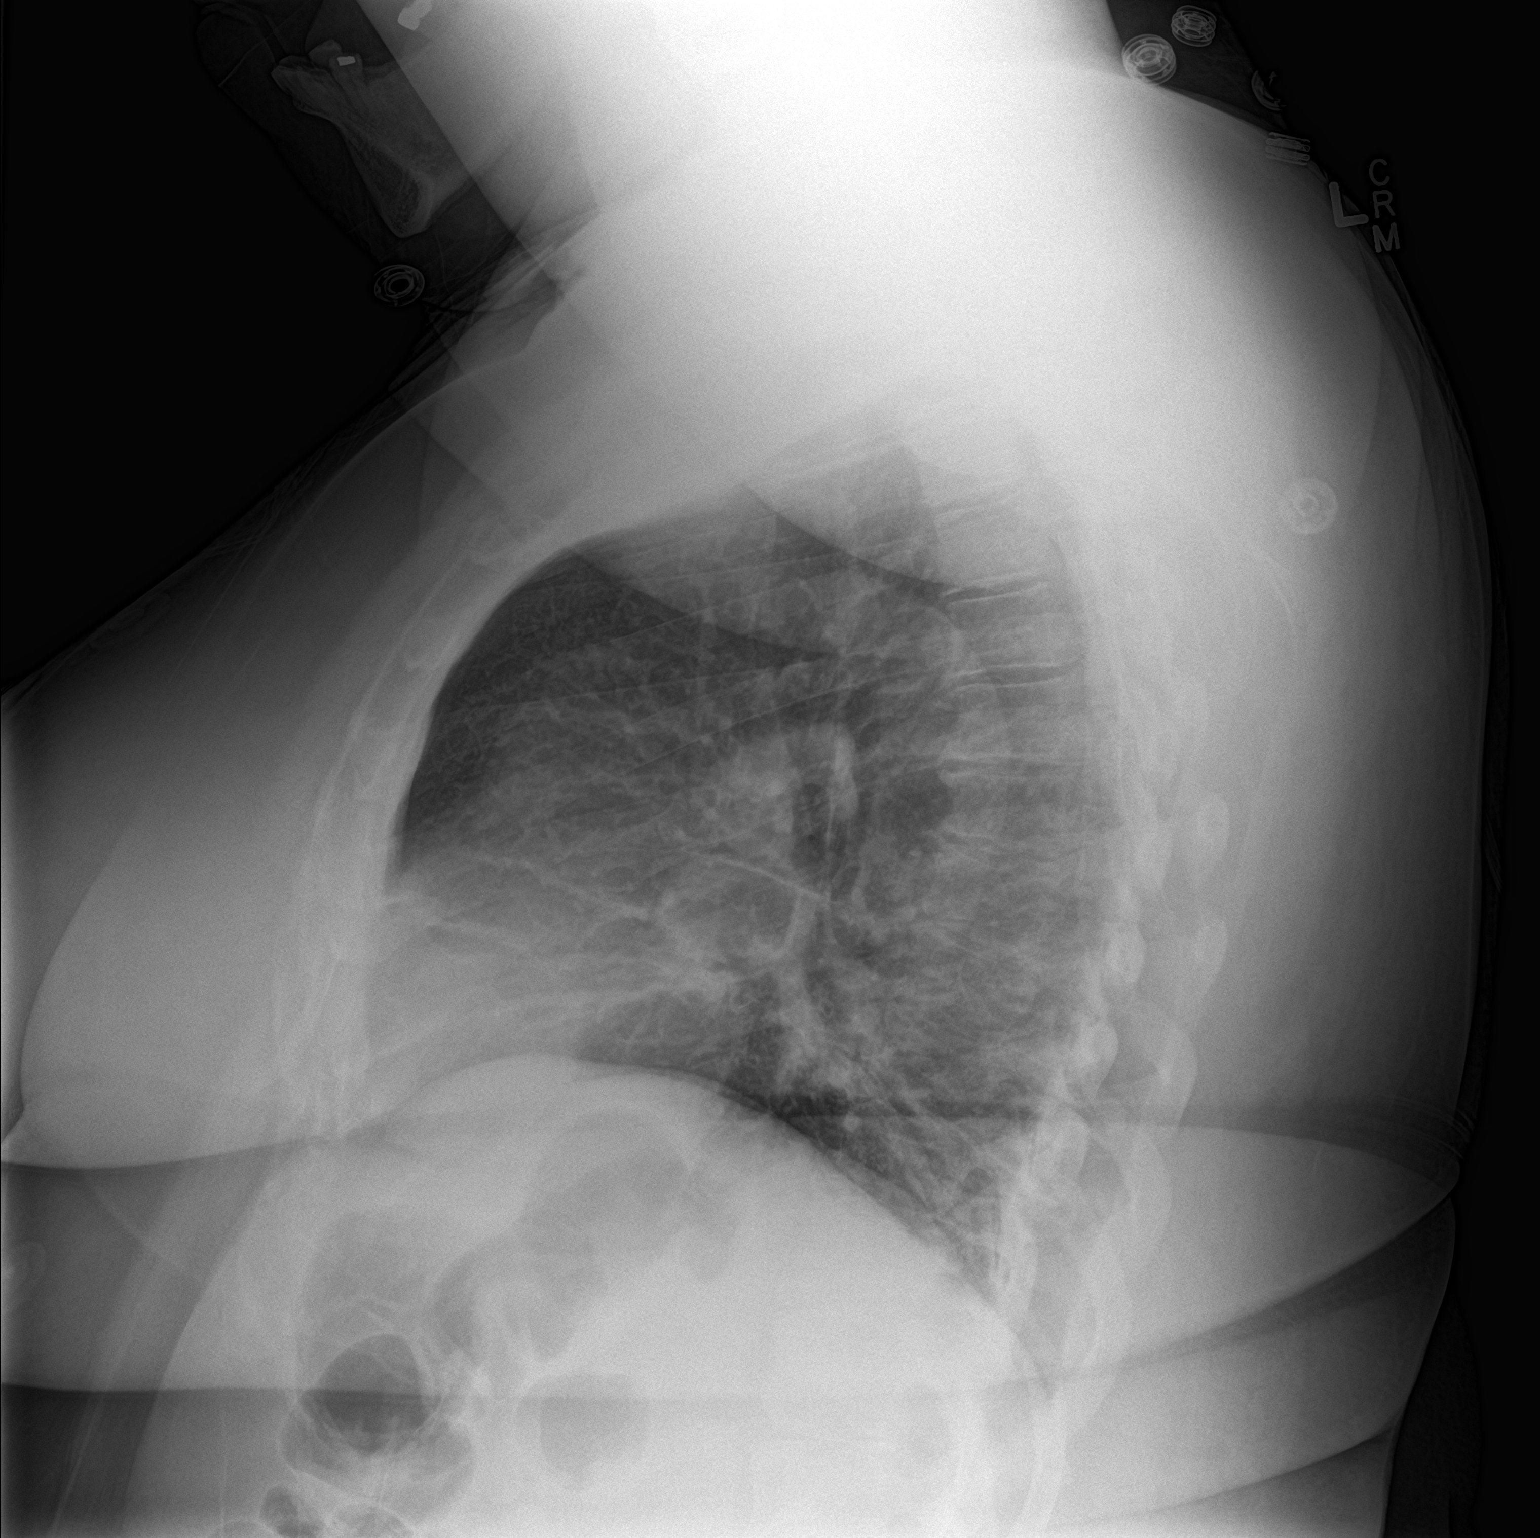

[2 of 2 positions shown; findings below may reference images not displayed]

FINDINGS: Mild cardiomegaly. Low volumes. Bibasilar atelectasis. Vascular
congestion. No sign of interstitial edema. Stable thoracic spine.
IMPRESSION: Cardiomegaly and vascular congestion.  Bibasilar atelectasis.

## 2018-04-20 ENCOUNTER — Emergency Department (HOSPITAL_COMMUNITY)
Admission: EM | Admit: 2018-04-20 | Discharge: 2018-04-20 | Disposition: A | Discharge status: Home / Self Care | Payer: Medicaid Other | Attending: Emergency Medicine | Admitting: Emergency Medicine

## 2018-04-20 ENCOUNTER — Encounter (HOSPITAL_COMMUNITY): Payer: Self-pay | Admitting: *Deleted

## 2018-04-20 ENCOUNTER — Emergency Department (HOSPITAL_COMMUNITY): Payer: Medicaid Other

## 2018-04-20 ENCOUNTER — Other Ambulatory Visit: Payer: Self-pay

## 2018-04-20 DIAGNOSIS — Z79899 Other long term (current) drug therapy: Secondary | ICD-10-CM | POA: Insufficient documentation

## 2018-04-20 DIAGNOSIS — R42 Dizziness and giddiness: Secondary | ICD-10-CM | POA: Diagnosis present

## 2018-04-20 DIAGNOSIS — I10 Essential (primary) hypertension: Secondary | ICD-10-CM | POA: Diagnosis not present

## 2018-04-20 DIAGNOSIS — E039 Hypothyroidism, unspecified: Secondary | ICD-10-CM | POA: Diagnosis not present

## 2018-04-20 DIAGNOSIS — F17210 Nicotine dependence, cigarettes, uncomplicated: Secondary | ICD-10-CM | POA: Insufficient documentation

## 2018-04-20 LAB — CBC
HCT: 37.3 % (ref 36.0–46.0)
Hemoglobin: 12 g/dL (ref 12.0–15.0)
MCH: 31.3 pg (ref 26.0–34.0)
MCHC: 32.2 g/dL (ref 30.0–36.0)
MCV: 97.1 fL (ref 78.0–100.0)
PLATELETS: 239 10*3/uL (ref 150–400)
RBC: 3.84 MIL/uL — AB (ref 3.87–5.11)
RDW: 13.1 % (ref 11.5–15.5)
WBC: 7.3 10*3/uL (ref 4.0–10.5)

## 2018-04-20 LAB — COMPREHENSIVE METABOLIC PANEL
ALBUMIN: 4 g/dL (ref 3.5–5.0)
ALK PHOS: 62 U/L (ref 38–126)
ALT: 28 U/L (ref 0–44)
ANION GAP: 10 (ref 5–15)
AST: 24 U/L (ref 15–41)
BILIRUBIN TOTAL: 0.8 mg/dL (ref 0.3–1.2)
BUN: 22 mg/dL — AB (ref 6–20)
CALCIUM: 9.1 mg/dL (ref 8.9–10.3)
CO2: 21 mmol/L — ABNORMAL LOW (ref 22–32)
Chloride: 103 mmol/L (ref 98–111)
Creatinine, Ser: 0.87 mg/dL (ref 0.44–1.00)
GFR calc Af Amer: >60 mL/min (ref >60)
GLUCOSE: 85 mg/dL (ref 70–99)
Potassium: 4.9 mmol/L (ref 3.5–5.1)
Sodium: 134 mmol/L — ABNORMAL LOW (ref 135–145)
TOTAL PROTEIN: 6.9 g/dL (ref 6.5–8.1)

## 2018-04-20 MED ORDER — MECLIZINE HCL 25 MG PO TABS
25.0000 mg | ORAL_TABLET | Freq: Once | ORAL | Status: AC
  Administered 2018-04-20: 25 mg via ORAL
  Filled 2018-04-20: qty 1

## 2018-04-20 MED ORDER — MECLIZINE HCL 25 MG PO TABS: 25.0000 mg | ORAL_TABLET | Freq: Three times a day (TID) | ORAL | 0 refills | Status: AC | PRN

## 2018-04-20 NOTE — ED Triage Notes (Signed)
The pt has had dizziness and passing out spells for 2-3 months.  Her doctor has been switching her bp meds around.  She also has chronic pain in her rt arm and she takes pain med for this  lmp none

## 2018-04-20 NOTE — ED Provider Notes (Signed)
[facility] EMERGENCY DEPARTMENT Provider Note   CSN: [id number] Arrival date & time: 04/20/18  1600     History   Chief Complaint Chief Complaint  Patient presents with  . Dizziness    HPI Anna Hamilton is a 57 y.o. female.  57 year old female with prior medical history as detailed below presents with complaint of near constant dizziness for the last 2 to 3 months.  She denies syncope.  She denies chest pain.  She denies shortness of breath.  She describes dizziness that is worse with rapid head movements or with rapid standing.  She has not taken any medications for this specifically.  She denies significant associated nausea.  The history is provided by the patient.  Dizziness  Quality:  Vertigo Severity:  Mild Onset quality:  Unable to specify Duration:  3 months Timing:  Constant Progression:  Waxing and waning Chronicity:  New Context: head movement   Relieved by:  Nothing Worsened by:  Nothing Ineffective treatments:  None tried   Past Medical History:  Diagnosis Date  . Bipolar 2 disorder (HCC)   . External hemorrhoids   . Hypertension   . Obesity   . Post traumatic stress disorder (PTSD)   . Schizo affective schizophrenia (HCC)   . Thyroid disease    hypothyroid    There are no active problems to display for this patient.   Past Surgical History:  Procedure Laterality Date  . BUNIONECTOMY  2011  . CESAREAN SECTION    . CHOLECYSTECTOMY  1998  . HEMORRHOID SURGERY  05/12/11   external     OB History   None      Home Medications    Prior to Admission medications   Medication Sig Start Date End Date Taking? Authorizing Provider  ARIPiprazole (ABILIFY) 5 MG tablet Take 5 mg by mouth daily.    [provider]  azithromycin (ZITHROMAX) 250 MG tablet Take 1 tablet (250 mg total) by mouth daily. Take first 2 tablets together, then 1 every day until finished. 12/09/12   Elpidio Anis, PA-C  benzonatate (TESSALON) 100 MG  capsule Take 1 capsule (100 mg total) by mouth 3 (three) times daily as needed for cough. 07/10/16   Everlene Farrier, PA-C  cyclobenzaprine (FLEXERIL) 10 MG tablet Take 20 mg by mouth 3 (three) times daily as needed for muscle spasms.    [provider]  cycloSPORINE (RESTASIS) 0.05 % ophthalmic emulsion Place 1 drop into both eyes 2 (two) times daily.    [provider]  fluticasone (FLONASE) 50 MCG/ACT nasal spray Place 2 sprays into both nostrils daily. 07/10/16   Everlene Farrier, PA-C  furosemide (LASIX) 40 MG tablet Take 40 mg by mouth daily.    [provider]  levothyroxine (SYNTHROID, LEVOTHROID) 150 MCG tablet Take 150 mcg by mouth daily.    [provider]  loratadine (CLARITIN) 10 MG tablet Take 1 tablet (10 mg total) by mouth daily. 12/09/12   Elpidio Anis, PA-C  meclizine (ANTIVERT) 25 MG tablet Take 1 tablet (25 mg total) by mouth 3 (three) times daily as needed for dizziness. 04/20/18   Wynetta Fines, MD  naproxen (NAPROSYN) 500 MG tablet Take 500 mg by mouth daily as needed (for pain).     [provider]  potassium chloride SA (K-DUR,KLOR-CON) 20 MEQ tablet Take 20 mEq by mouth daily.    [provider]  sertraline (ZOLOFT) 50 MG tablet Take 50 mg by mouth daily.  [provider]    Family History No family history on file.  Social History Social History   Tobacco Use  . Smoking status: Current Some Day Smoker    Packs/day: 0.25    Types: Cigarettes  . Smokeless tobacco: Never Used  Substance Use Topics  . Alcohol use: Yes  . Drug use: No     Allergies   Lamotrigine   Review of Systems Review of Systems  Neurological: Positive for dizziness.  All other systems reviewed and are negative.    Physical Exam Updated Vital Signs BP 117/71   Pulse (!) 57   Temp 97.6 F (36.4 C)   Resp 12   Ht 5\' 5"  (1.651 m)   Wt 127.9 kg   LMP  (LMP Unknown)   SpO2 99%   BMI 46.93 kg/m   Physical Exam   Constitutional: She is oriented to person, place, and time. She appears well-developed and well-nourished. No distress.  HENT:  Head: Normocephalic and atraumatic.  Mouth/Throat: Oropharynx is clear and moist.  Eyes: Pupils are equal, round, and reactive to light. Conjunctivae and EOM are normal.  Neck: Normal range of motion. Neck supple.  Cardiovascular: Normal rate, regular rhythm and normal heart sounds.  Pulmonary/Chest: Effort normal and breath sounds normal. No respiratory distress.  Abdominal: Soft. She exhibits no distension. There is no tenderness.  Musculoskeletal: Normal range of motion. She exhibits no edema or deformity.  Neurological: She is alert and oriented to person, place, and time.  Skin: Skin is warm and dry.  Psychiatric: She has a normal mood and affect.  Nursing note and vitals reviewed.    ED Treatments / Results  Labs (all labs ordered are listed, but only abnormal results are displayed) Labs Reviewed  COMPREHENSIVE METABOLIC PANEL - Abnormal; Notable for the following components:      Result Value   Sodium 134 (*)    CO2 21 (*)    BUN 22 (*)    All other components within normal limits  CBC - Abnormal; Notable for the following components:   RBC 3.84 (*)    All other components within normal limits    EKG EKG Interpretation  Date/Time:  Saturday April 20 2018 16:08:47 EDT Ventricular Rate:  70 PR Interval:  180 QRS Duration: 84 QT Interval:  388 QTC Calculation: 419 R Axis:   -7 Text Interpretation:  Normal sinus rhythm Possible Anterior infarct , age undetermined Abnormal ECG Confirmed by Kristine Royal [phone]) on 04/20/2018 4:53:59 PM   Radiology Ct Head Wo Contrast  Result Date: 04/20/2018 CLINICAL DATA:  Dizziness with syncopal episodes over the past 2-3 months. Her physician as then switching her blood pressure medications. EXAM: CT HEAD WITHOUT CONTRAST TECHNIQUE: Contiguous axial images were obtained from the base of the skull  through the vertex without intravenous contrast. COMPARISON:  None. FINDINGS: Brain: Asymmetric appearance of the lateral ventricles left more prominent than right with which can be a normal variant. No intra-axial mass, extra-axial fluid collection, hemorrhage or midline shift. No large vascular territory infarct. Vascular: No hyperdense vessel sign. Skull: Negative for fracture or focal lesions. Sinuses/Orbits: Intact orbits. No retrobulbar abnormality. Clear paranasal sinuses and mastoids. Other: None IMPRESSION: No acute intracranial abnormality is identified. Electronically Signed   By: Tollie Eth M.D.   On: 04/20/2018 18:23    Procedures Procedures (including critical care time)  Medications Ordered in ED Medications  meclizine (ANTIVERT) tablet 25 mg (25 mg Oral Given 04/20/18 1740)  Initial Impression / Assessment and Plan / ED Course  I have reviewed the triage vital signs and the nursing notes.  Pertinent labs & imaging results that were available during my care of the patient were reviewed by me and considered in my medical decision making (see chart for details).     MDM  Screen complete  Patient is presenting for evaluation of symptoms most consistent with mild vertigo.  Screening evaluation does not reveal significant acute pathology.  Patient feels improved following her ED evaluation.  She desires discharge home.  She understands need for close follow-up.  Strict return precautions given and understood.  Final Clinical Impressions(s) / ED Diagnoses   Final diagnoses:  Dizziness  Vertigo    ED Discharge Orders         Ordered    meclizine (ANTIVERT) 25 MG tablet  3 times daily PRN     04/20/18 1839           Wynetta Fines, MD 04/20/18 1842

## 2018-04-20 NOTE — Discharge Instructions (Addendum)
Return for any problem.  Follow-up with your regular doctor in 2 to 3 days as instructed.

## 2018-04-20 NOTE — ED Notes (Signed)
Pt stable and ambulatory for discharge, states understanding follow up.  

## 2018-08-27 ENCOUNTER — Encounter (HOSPITAL_COMMUNITY): Payer: Self-pay

## 2018-08-27 ENCOUNTER — Emergency Department (HOSPITAL_COMMUNITY)
Admission: EM | Admit: 2018-08-27 | Discharge: 2018-08-27 | Disposition: A | Discharge status: Home / Self Care | Payer: Medicaid Other | Attending: Emergency Medicine | Admitting: Emergency Medicine

## 2018-08-27 ENCOUNTER — Emergency Department (HOSPITAL_COMMUNITY): Payer: Medicaid Other

## 2018-08-27 ENCOUNTER — Other Ambulatory Visit: Payer: Self-pay

## 2018-08-27 DIAGNOSIS — Y939 Activity, unspecified: Secondary | ICD-10-CM | POA: Insufficient documentation

## 2018-08-27 DIAGNOSIS — I10 Essential (primary) hypertension: Secondary | ICD-10-CM | POA: Insufficient documentation

## 2018-08-27 DIAGNOSIS — Y929 Unspecified place or not applicable: Secondary | ICD-10-CM | POA: Insufficient documentation

## 2018-08-27 DIAGNOSIS — S39012A Strain of muscle, fascia and tendon of lower back, initial encounter: Secondary | ICD-10-CM | POA: Diagnosis not present

## 2018-08-27 DIAGNOSIS — M542 Cervicalgia: Secondary | ICD-10-CM | POA: Insufficient documentation

## 2018-08-27 DIAGNOSIS — F17210 Nicotine dependence, cigarettes, uncomplicated: Secondary | ICD-10-CM | POA: Diagnosis not present

## 2018-08-27 DIAGNOSIS — E039 Hypothyroidism, unspecified: Secondary | ICD-10-CM | POA: Diagnosis not present

## 2018-08-27 DIAGNOSIS — Y999 Unspecified external cause status: Secondary | ICD-10-CM | POA: Insufficient documentation

## 2018-08-27 DIAGNOSIS — Z79899 Other long term (current) drug therapy: Secondary | ICD-10-CM | POA: Insufficient documentation

## 2018-08-27 DIAGNOSIS — F250 Schizoaffective disorder, bipolar type: Secondary | ICD-10-CM | POA: Diagnosis not present

## 2018-08-27 DIAGNOSIS — R51 Headache: Secondary | ICD-10-CM | POA: Diagnosis not present

## 2018-08-27 DIAGNOSIS — M545 Low back pain: Secondary | ICD-10-CM | POA: Diagnosis present

## 2018-08-27 HISTORY — DX: Dizziness and giddiness: R42

## 2018-08-27 MED ORDER — LIDOCAINE 5 % EX PTCH
1.0000 | MEDICATED_PATCH | CUTANEOUS | Status: DC
  Administered 2018-08-27: 1 via TRANSDERMAL
  Filled 2018-08-27: qty 1

## 2018-08-27 MED ORDER — LIDOCAINE 5 % EX PTCH: 1.0000 | MEDICATED_PATCH | CUTANEOUS | 0 refills | Status: AC

## 2018-08-27 MED ORDER — OXYCODONE-ACETAMINOPHEN 5-325 MG PO TABS
1.0000 | ORAL_TABLET | Freq: Once | ORAL | Status: AC
  Administered 2018-08-27: 1 via ORAL
  Filled 2018-08-27: qty 1

## 2018-08-27 NOTE — ED Notes (Signed)
Pt ambulated in hallway with no issue. Pt stable and not needing assistance.

## 2018-08-27 NOTE — ED Notes (Signed)
Patient verbalizes understanding of discharge instructions. Opportunity for questioning and answers were provided. Armband removed by staff, pt discharged from ED ambulatory.   
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
## 2018-09-04 ENCOUNTER — Emergency Department (HOSPITAL_COMMUNITY)
Admission: EM | Admit: 2018-09-04 | Discharge: 2018-09-04 | Disposition: A | Discharge status: Home / Self Care | Payer: Medicaid Other | Attending: Emergency Medicine | Admitting: Emergency Medicine

## 2018-09-04 ENCOUNTER — Encounter (HOSPITAL_COMMUNITY): Payer: Self-pay | Admitting: Emergency Medicine

## 2018-09-04 ENCOUNTER — Other Ambulatory Visit: Payer: Self-pay

## 2018-09-04 DIAGNOSIS — Z79899 Other long term (current) drug therapy: Secondary | ICD-10-CM | POA: Insufficient documentation

## 2018-09-04 DIAGNOSIS — R519 Headache, unspecified: Secondary | ICD-10-CM

## 2018-09-04 DIAGNOSIS — M545 Low back pain, unspecified: Secondary | ICD-10-CM

## 2018-09-04 DIAGNOSIS — E039 Hypothyroidism, unspecified: Secondary | ICD-10-CM | POA: Insufficient documentation

## 2018-09-04 DIAGNOSIS — F17210 Nicotine dependence, cigarettes, uncomplicated: Secondary | ICD-10-CM | POA: Diagnosis not present

## 2018-09-04 DIAGNOSIS — I10 Essential (primary) hypertension: Secondary | ICD-10-CM | POA: Diagnosis not present

## 2018-09-04 DIAGNOSIS — R51 Headache: Secondary | ICD-10-CM | POA: Insufficient documentation

## 2018-09-04 MED ORDER — DEXAMETHASONE SODIUM PHOSPHATE 10 MG/ML IJ SOLN
10.0000 mg | Freq: Once | INTRAMUSCULAR | Status: AC
  Administered 2018-09-04: 10 mg via INTRAVENOUS
  Filled 2018-09-04: qty 1

## 2018-09-04 MED ORDER — KETOROLAC TROMETHAMINE 30 MG/ML IJ SOLN
30.0000 mg | Freq: Once | INTRAMUSCULAR | Status: AC
  Administered 2018-09-04: 30 mg via INTRAVENOUS
  Filled 2018-09-04: qty 1

## 2018-09-04 MED ORDER — PROCHLORPERAZINE EDISYLATE 10 MG/2ML IJ SOLN
10.0000 mg | Freq: Once | INTRAMUSCULAR | Status: AC
  Administered 2018-09-04: 10 mg via INTRAVENOUS
  Filled 2018-09-04: qty 2
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
## 2019-03-03 ENCOUNTER — Other Ambulatory Visit: Payer: Self-pay

## 2019-03-03 ENCOUNTER — Emergency Department (HOSPITAL_COMMUNITY)
Admission: EM | Admit: 2019-03-03 | Discharge: 2019-03-03 | Disposition: A | Discharge status: Home / Self Care | Payer: Medicaid Other | Attending: Emergency Medicine | Admitting: Emergency Medicine

## 2019-03-03 ENCOUNTER — Encounter (HOSPITAL_COMMUNITY): Payer: Self-pay

## 2019-03-03 ENCOUNTER — Emergency Department (HOSPITAL_COMMUNITY): Payer: Medicaid Other

## 2019-03-03 DIAGNOSIS — R079 Chest pain, unspecified: Secondary | ICD-10-CM | POA: Diagnosis present

## 2019-03-03 DIAGNOSIS — Z5321 Procedure and treatment not carried out due to patient leaving prior to being seen by health care provider: Secondary | ICD-10-CM | POA: Diagnosis not present

## 2019-03-03 LAB — BASIC METABOLIC PANEL
Anion gap: 8 (ref 5–15)
BUN: 22 mg/dL — ABNORMAL HIGH (ref 6–20)
CO2: 26 mmol/L (ref 22–32)
Calcium: 9.3 mg/dL (ref 8.9–10.3)
Chloride: 105 mmol/L (ref 98–111)
Creatinine, Ser: 0.76 mg/dL (ref 0.44–1.00)
GFR calc Af Amer: >60 mL/min (ref >60)
GFR calc non Af Amer: >60 mL/min (ref >60)
Glucose, Bld: 95 mg/dL (ref 70–99)
Potassium: 4.1 mmol/L (ref 3.5–5.1)
Sodium: 139 mmol/L (ref 135–145)

## 2019-03-03 LAB — CBC
HCT: 35.4 % — ABNORMAL LOW (ref 36.0–46.0)
Hemoglobin: 11.4 g/dL — ABNORMAL LOW (ref 12.0–15.0)
MCH: 30.2 pg (ref 26.0–34.0)
MCHC: 32.2 g/dL (ref 30.0–36.0)
MCV: 93.7 fL (ref 80.0–100.0)
Platelets: 254 10*3/uL (ref 150–400)
RBC: 3.78 MIL/uL — ABNORMAL LOW (ref 3.87–5.11)
RDW: 12.1 % (ref 11.5–15.5)
WBC: 4.7 10*3/uL (ref 4.0–10.5)
nRBC: 0 % (ref 0.0–0.2)

## 2019-03-03 LAB — TROPONIN I (HIGH SENSITIVITY): Troponin I (High Sensitivity): 6 ng/L (ref <18)

## 2019-03-03 LAB — I-STAT BETA HCG BLOOD, ED (MC, WL, AP ONLY): I-stat hCG, quantitative: <5 m[IU]/mL (ref <5)

## 2019-03-03 NOTE — ED Notes (Signed)
Pt states "you can scratch Fromer off because I'm out of here".

## 2019-03-03 NOTE — ED Triage Notes (Signed)
Pt endorses being sent by pcp due to blood pressure issues, palpitations and central chest pain radiating to the back. Denies shob but is always dizzy. Axox4.

## 2019-08-23 IMAGING — CT CT HEAD W/O CM
3 of 4 series · 15 of 47 positions shown, 18 images · non-contrast
Comparison: None.

CLINICAL DATA: Dizziness with syncopal episodes over the past 2-3
months. Her physician as then switching her blood pressure
medications.

EXAM:
CT HEAD WITHOUT CONTRAST
TECHNIQUE: Contiguous axial images were obtained from the base of the skull
through the vertex without intravenous contrast.

[Series 4: head 2.0 h70h · axial · 0.44mm/px · z∈[-131,-1]mm · 9 of 83 slices shown, 12 images]
[im 9/83  brain]
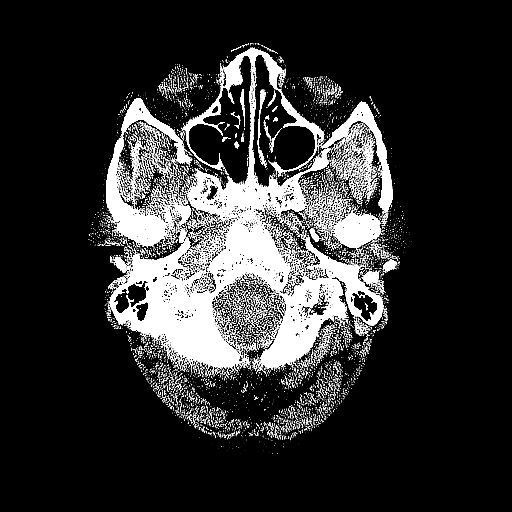
[im 9/83  bone]
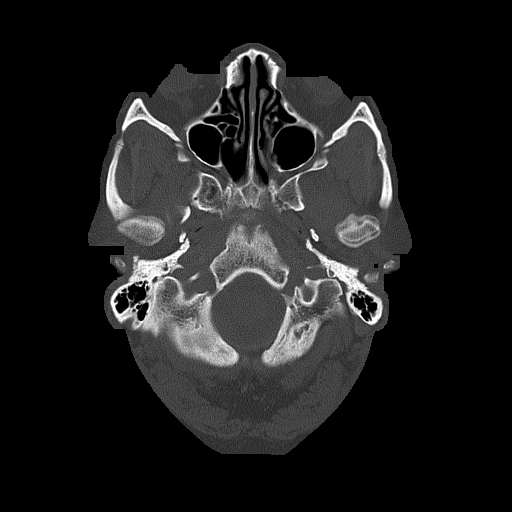
[im 17/83  brain]
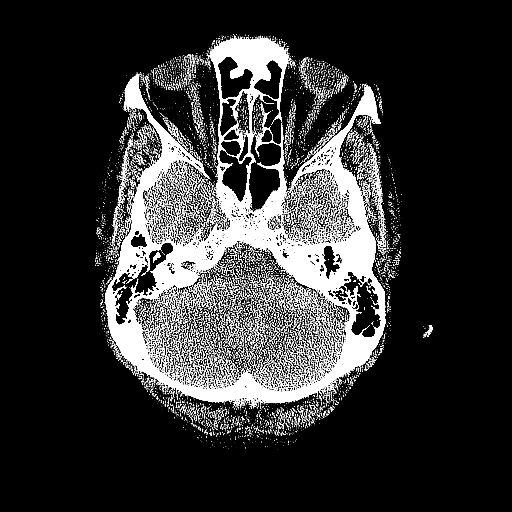
[im 25/83  brain]
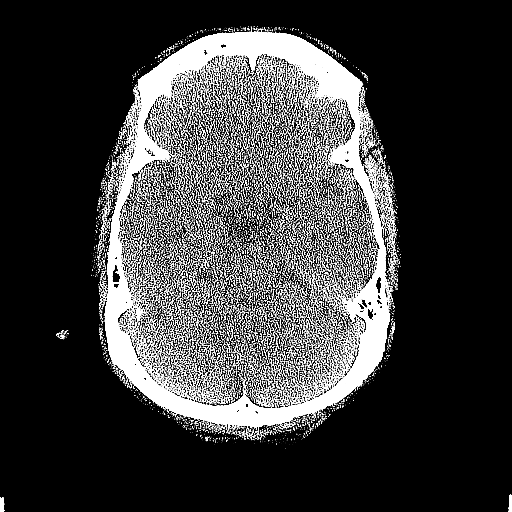
[im 33/83  brain]
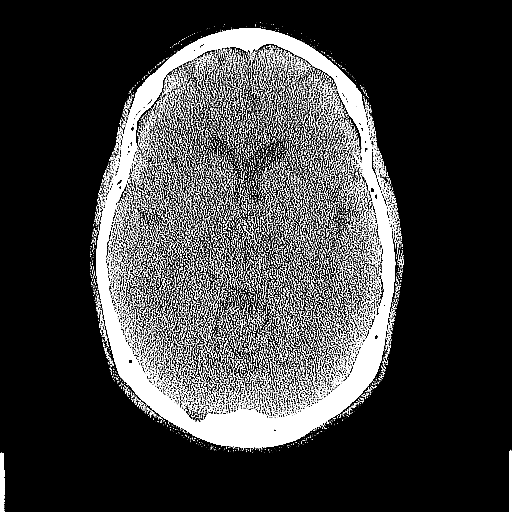
[im 42/83  brain]
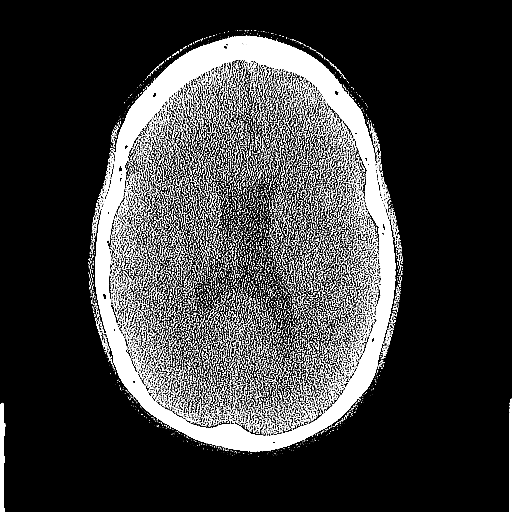
[im 42/83  bone]
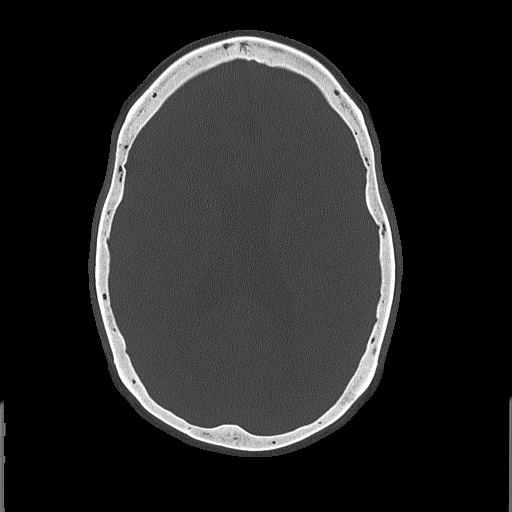
[im 50/83  brain]
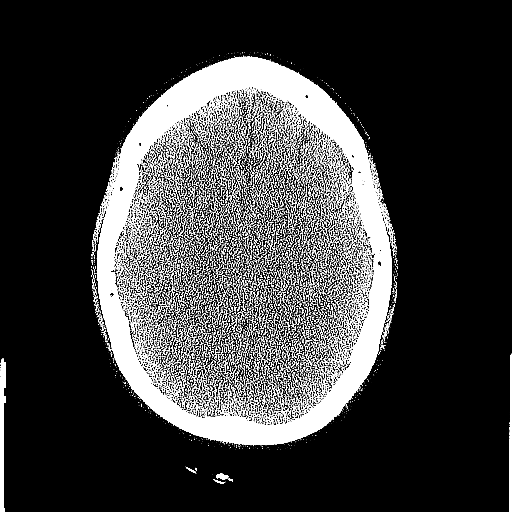
[im 58/83  brain]
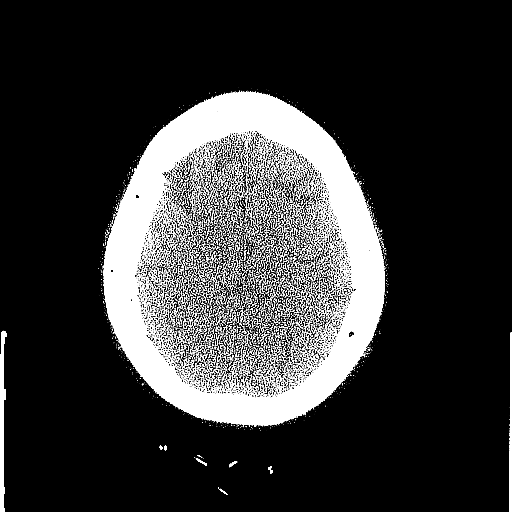
[im 66/83  brain]
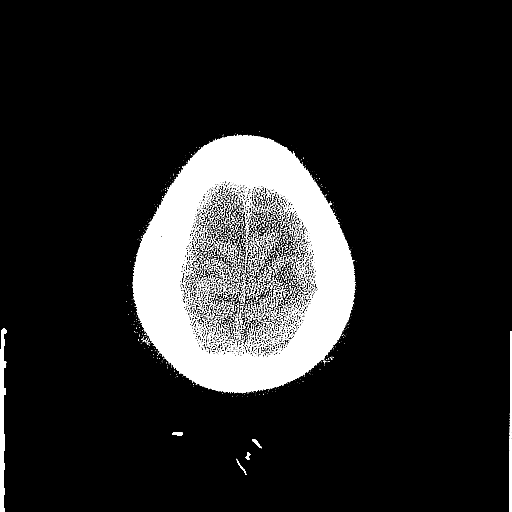
[im 74/83  brain]
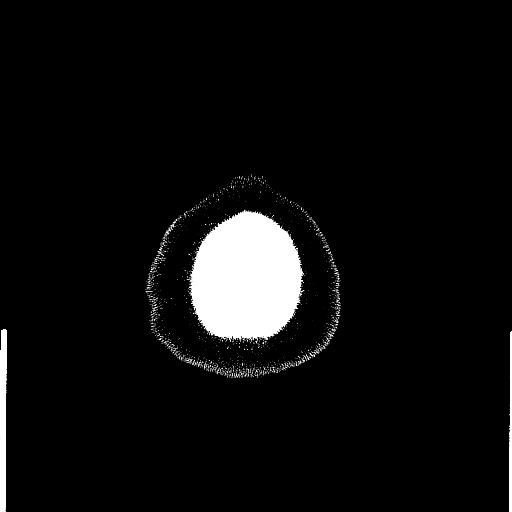
[im 74/83  bone]
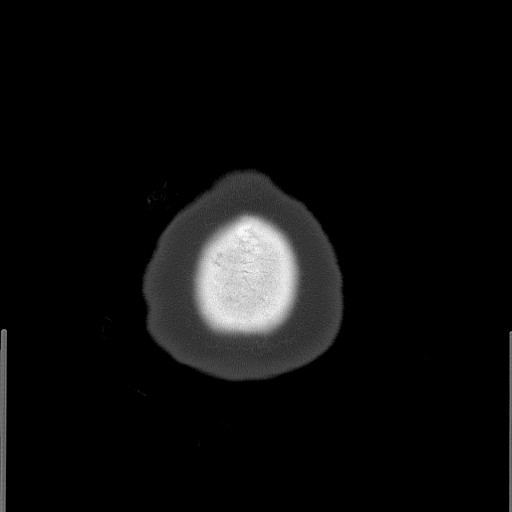

[Series 5: head 3.0 mpr cor · coronal · 0.37mm/px · 3 of 73 slices shown]
[im 25/73  brain]
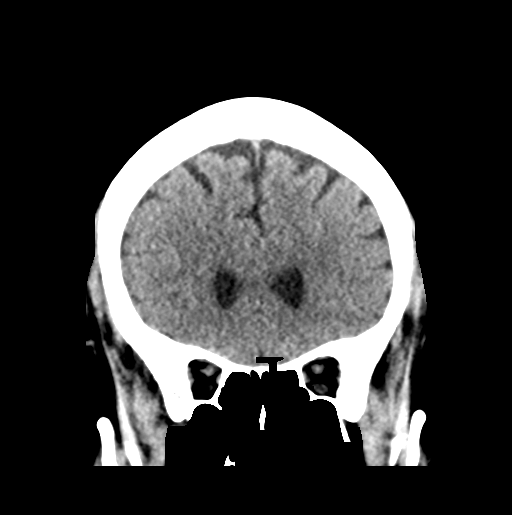
[im 33/73  brain]
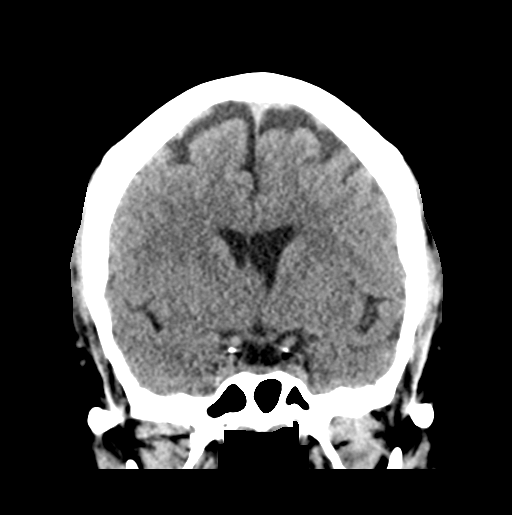
[im 41/73  brain]
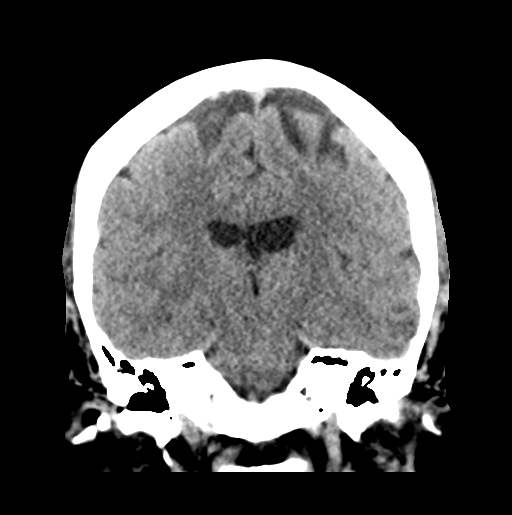

[Series 6: head 3.0 mpr sag · sagittal · 0.37mm/px · 3 of 59 slices shown]
[im 20/59  brain]
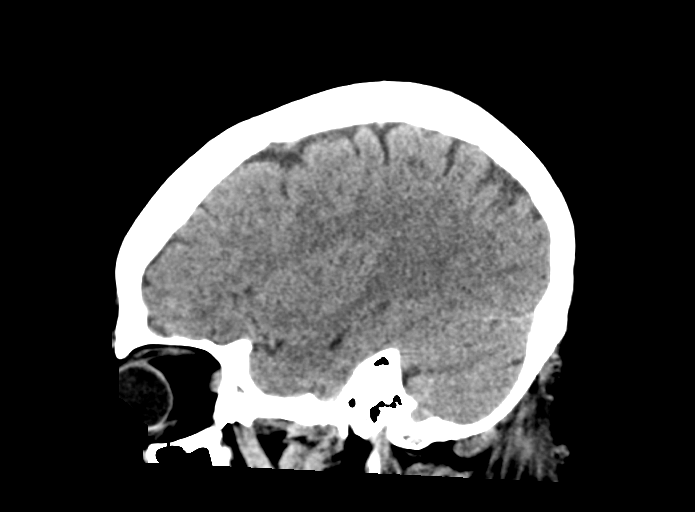
[im 30/59  brain]
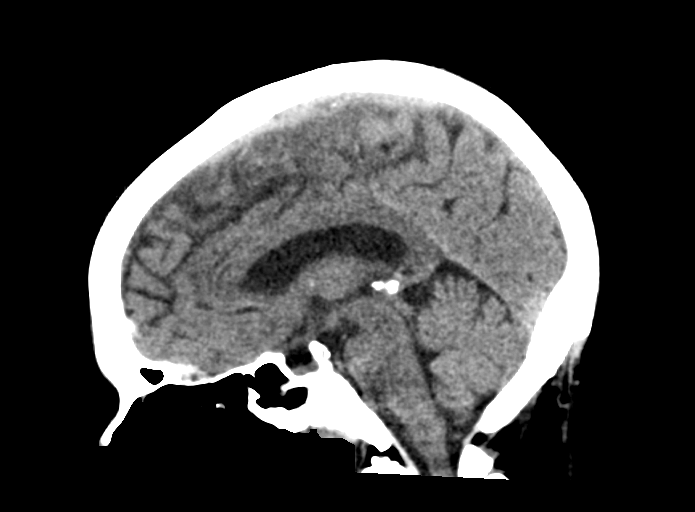
[im 39/59  brain]
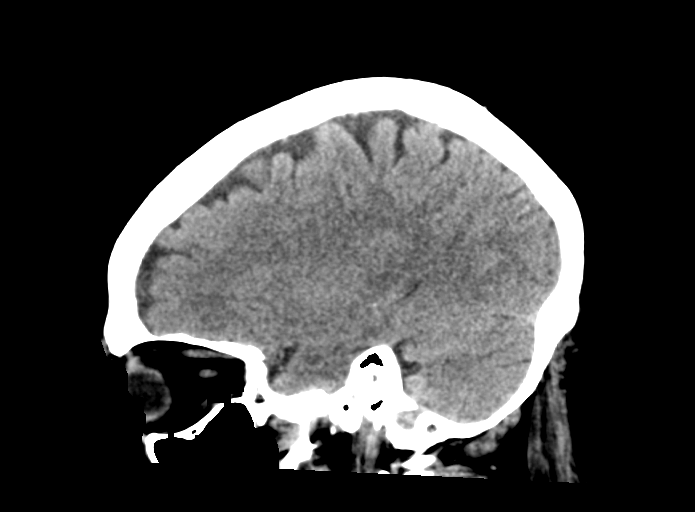

[15 of 47 positions shown; findings below may reference images not displayed]

FINDINGS: Brain: Asymmetric appearance of the lateral ventricles left more
prominent than right with which can be a normal variant. No
intra-axial mass, extra-axial fluid collection, hemorrhage or
midline shift. No large vascular territory infarct.

Vascular: No hyperdense vessel sign.

Skull: Negative for fracture or focal lesions.

Sinuses/Orbits: Intact orbits. No retrobulbar abnormality. Clear
paranasal sinuses and mastoids.

Other: None
IMPRESSION: No acute intracranial abnormality is identified.

## 2020-03-15 ENCOUNTER — Ambulatory Visit (HOSPITAL_COMMUNITY): Payer: Medicaid Other | Admitting: Psychiatry

## 2020-05-21 ENCOUNTER — Ambulatory Visit: Payer: No Typology Code available for payment source | Attending: Internal Medicine

## 2020-05-21 DIAGNOSIS — Z23 Encounter for immunization: Secondary | ICD-10-CM

## 2020-05-21 NOTE — Progress Notes (Signed)
   Covid-19 Vaccination Clinic  Name:  Anna Hamilton    MRN: [id number] DOB: 1961/07/01  05/21/2020  [name] was observed post Covid-19 immunization for 15 minutes without incident. She was provided with Vaccine Information Sheet and instruction to access the V-Safe system.   [name] was instructed to call 911 with any severe reactions post vaccine: Marland Kitchen Difficulty breathing  . Swelling of face and throat  . A fast heartbeat  . A bad rash all over body  . Dizziness and weakness

## 2020-07-05 IMAGING — CR CHEST - 2 VIEW
2 series · 2 of 2 positions shown · non-contrast
Comparison: 06/18/2017

CLINICAL DATA: Chest pain

EXAM:
CHEST - 2 VIEW

[chest pa]
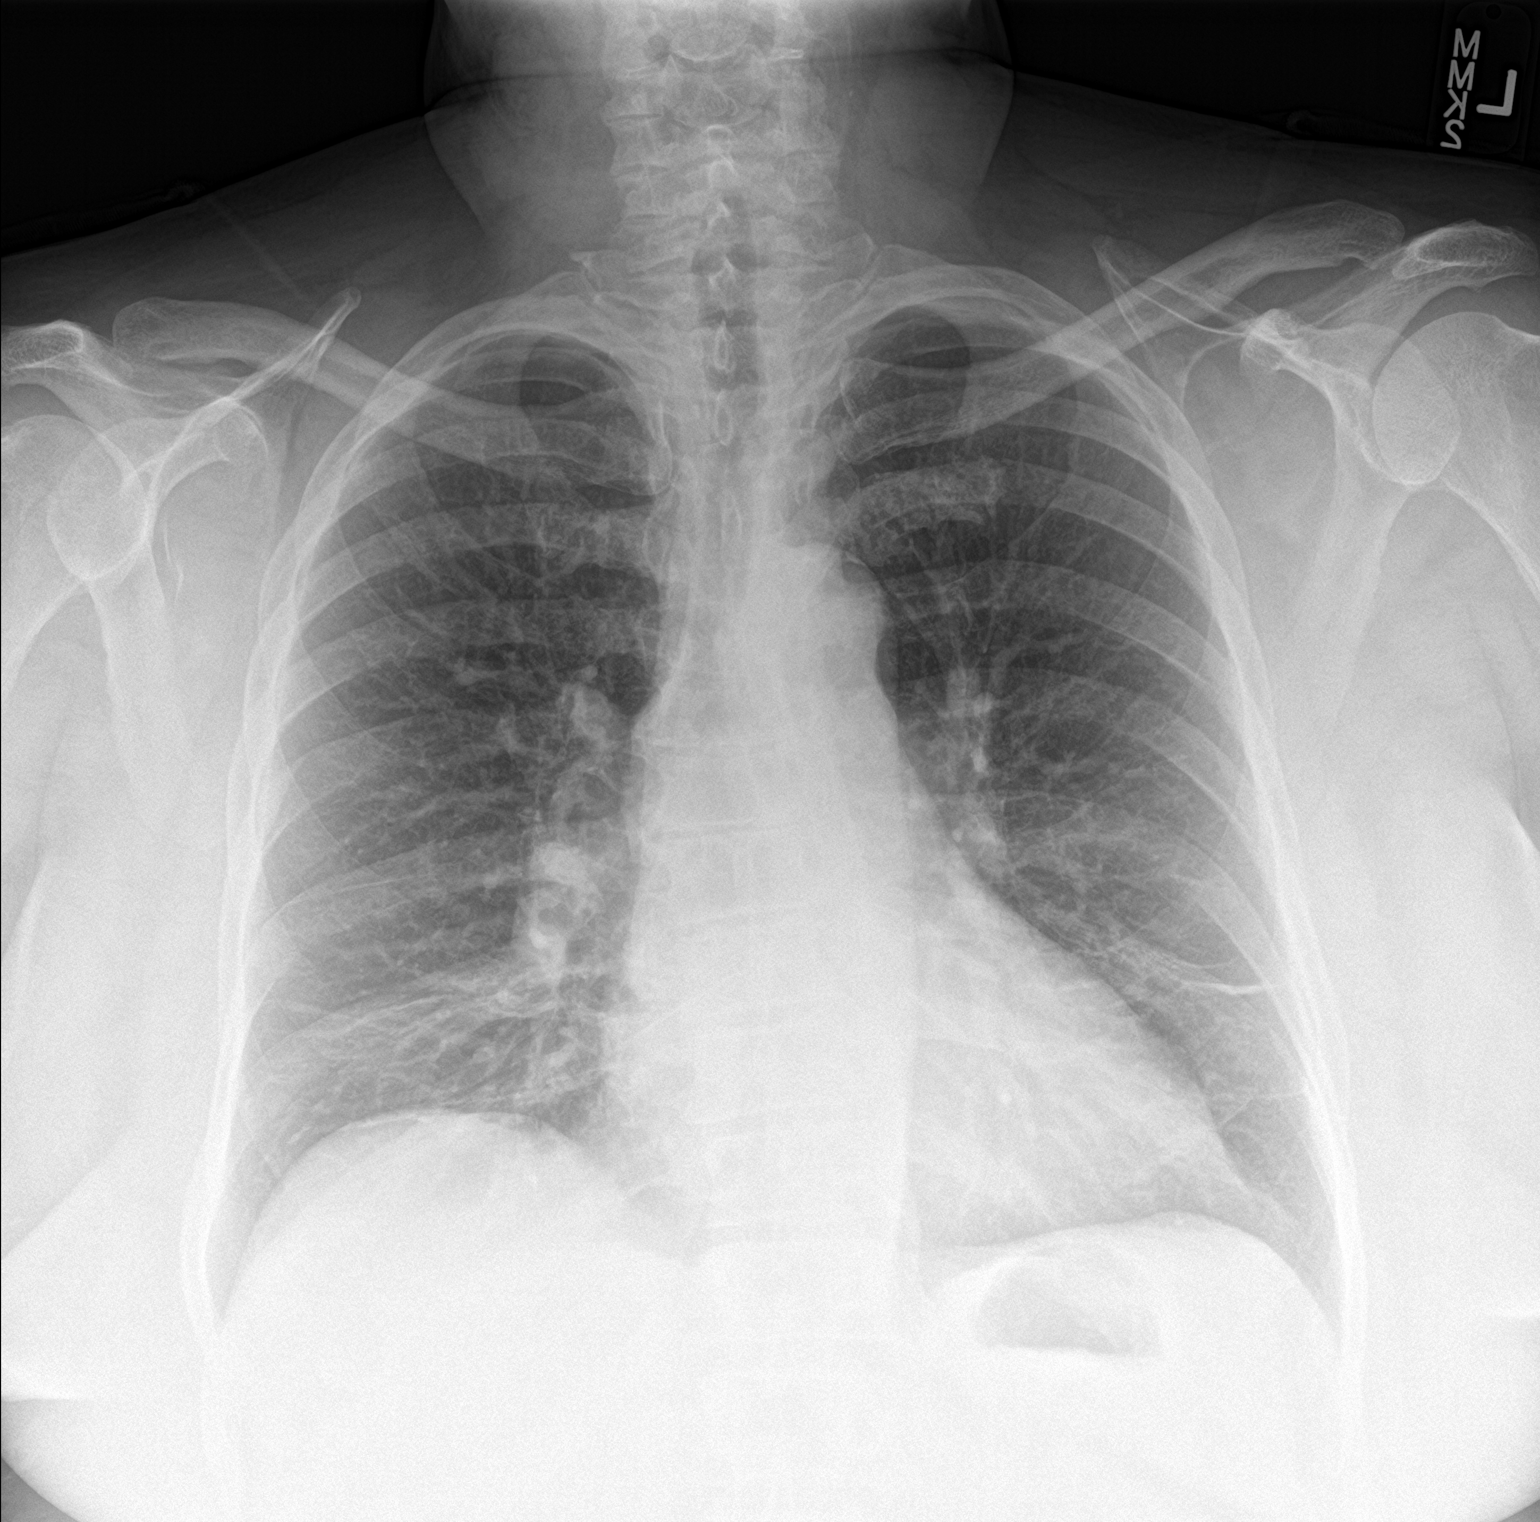

[chest lat]
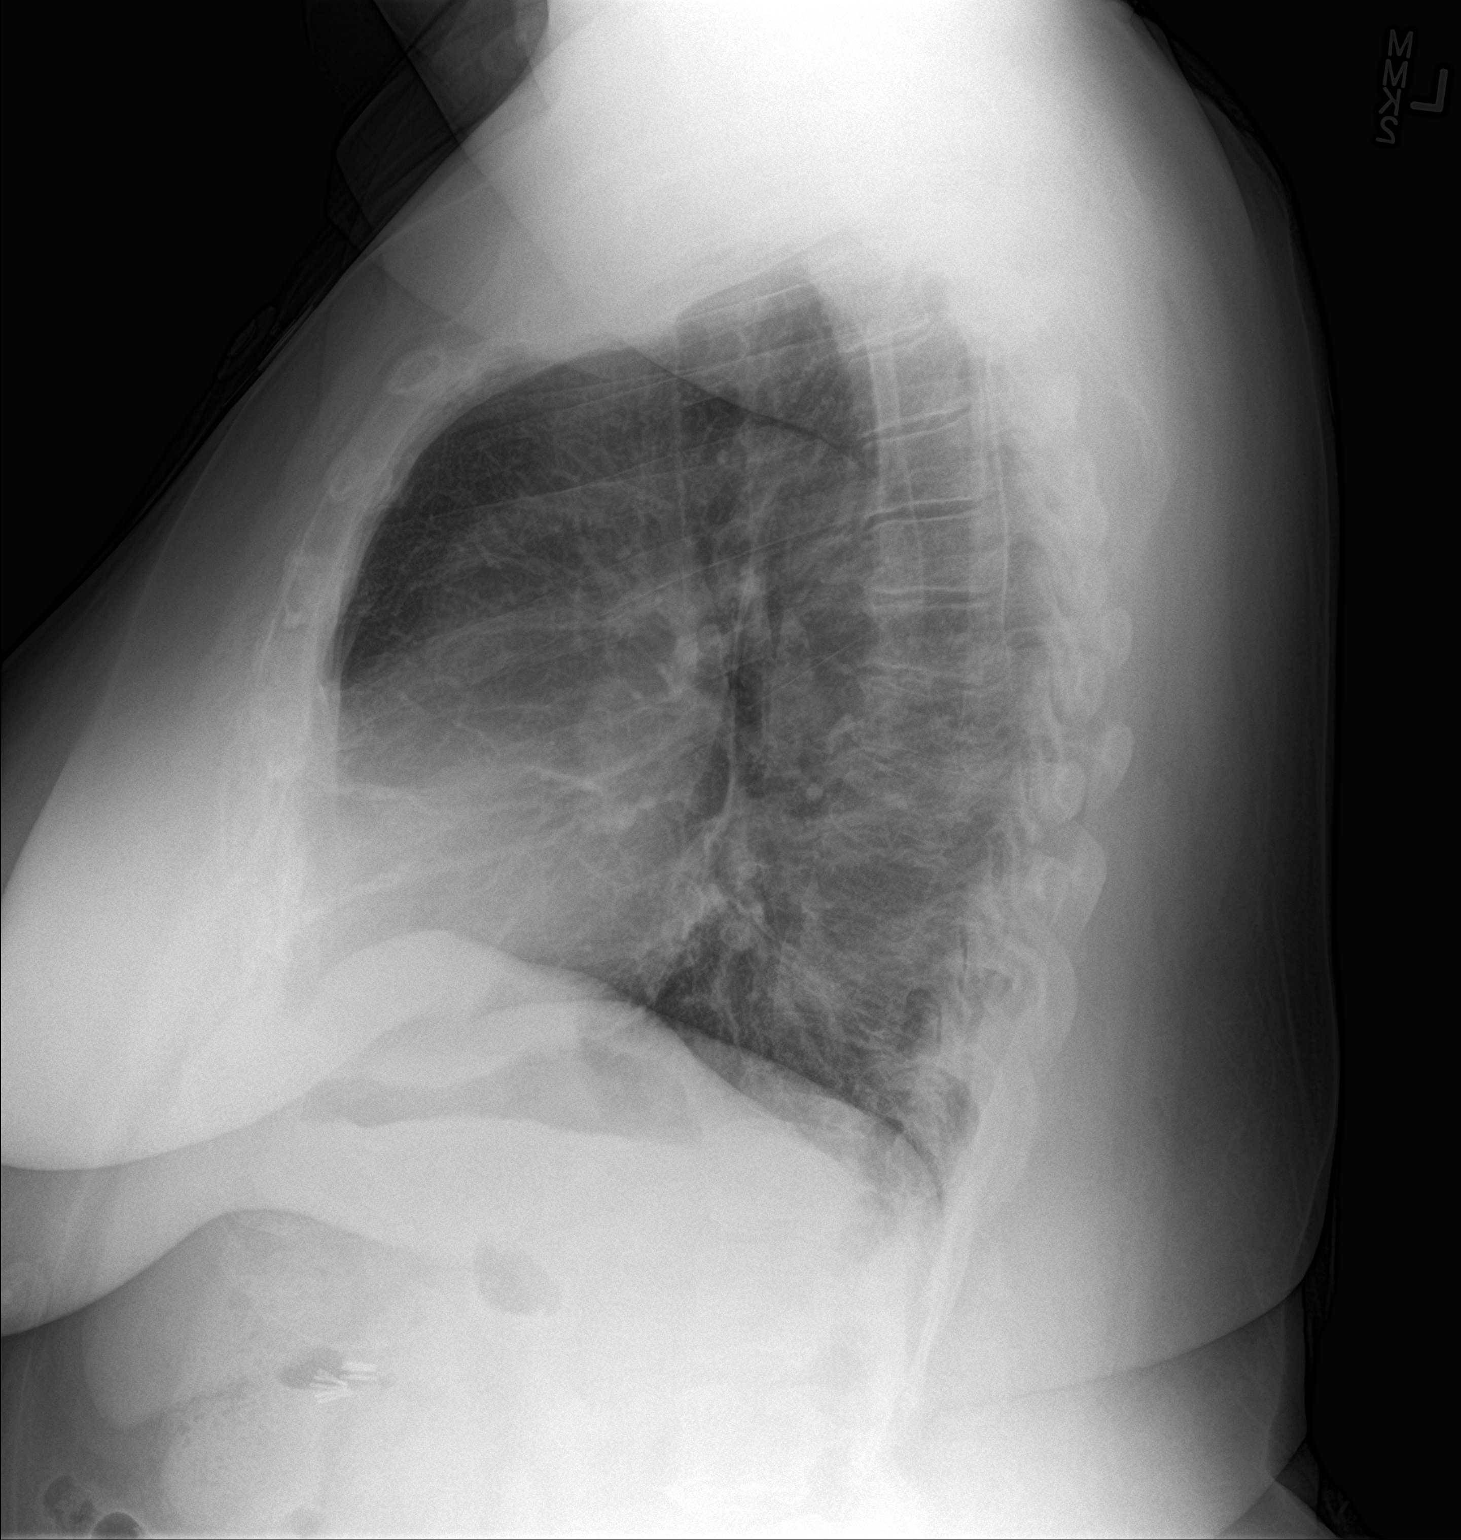

[2 of 2 positions shown; findings below may reference images not displayed]

FINDINGS: The heart size and mediastinal contours are within normal limits.
Minimal bibasilar atelectasis. Lungs otherwise clear. No focal
airspace consolidation, pleural effusion, or pneumothorax. No acute
osseous abnormalities identified.
IMPRESSION: Minimal bibasilar atelectasis.  Lungs otherwise clear.

## 2020-12-09 ENCOUNTER — Ambulatory Visit (HOSPITAL_COMMUNITY)
Admission: EM | Admit: 2020-12-09 | Discharge: 2020-12-09 | Disposition: A | Discharge status: Home / Self Care | Payer: Medicaid Other | Attending: Nurse Practitioner | Admitting: Nurse Practitioner

## 2020-12-09 ENCOUNTER — Encounter (HOSPITAL_COMMUNITY): Payer: Self-pay

## 2020-12-09 ENCOUNTER — Other Ambulatory Visit: Payer: Self-pay

## 2020-12-09 DIAGNOSIS — N764 Abscess of vulva: Secondary | ICD-10-CM | POA: Insufficient documentation

## 2020-12-09 DIAGNOSIS — N898 Other specified noninflammatory disorders of vagina: Secondary | ICD-10-CM | POA: Diagnosis not present

## 2020-12-09 MED ORDER — SULFAMETHOXAZOLE-TRIMETHOPRIM 800-160 MG PO TABS: 1.0000 | ORAL_TABLET | Freq: Two times a day (BID) | ORAL | 0 refills | Status: AC

## 2020-12-09 NOTE — ED Provider Notes (Signed)
[facility]    CSN: [id number] Arrival date & time: 12/09/20  1008      History   Chief Complaint Chief Complaint  Patient presents with  . Abscess    HPI Anna Hamilton is a 60 y.o. female.   Subjective:   Anna Hamilton is a 60 y.o. female who presents for evaluation of a probable cutaneous abscess. Lesion is located in the vaginal region. Onset was 2 days ago. Symptoms have been well-controlled.  Abscess has associated symptoms of spontaneous drainage and pain.  Patient used warm compresses which promoted the drainage.  There is less pressure in that area now.  She also feels that there is a developing abscess just above the one that has already started to drain.  Drainage has been described as a light pinkish/white substance. The patient does not have previous history of cutaneous abscesses. Patient does not have diabetes.  Notably, the patient expresses concerns with the possibility of herpes infection.  Patient states that she was told by another provider several years ago that a lesion that she had on her shoulder was due to herpes and has never had to be treated for it.  The following portions of the patient's history were reviewed and updated as appropriate: allergies, current medications, past family history, past medical history, past social history, past surgical history and problem list.        Past Medical History:  Diagnosis Date  . Bipolar 2 disorder (HCC)   . External hemorrhoids   . Hypertension   . Obesity   . Post traumatic stress disorder (PTSD)   . Schizo affective schizophrenia (HCC)   . Thyroid disease    hypothyroid  . Vertigo     There are no problems to display for this patient.   Past Surgical History:  Procedure Laterality Date  . BUNIONECTOMY  2011  . CESAREAN SECTION    . CHOLECYSTECTOMY  1998  . HEMORRHOID SURGERY  05/12/11   external    OB History   No obstetric history on file.      Home Medications    Prior to  Admission medications   Medication Sig Start Date End Date Taking? Authorizing Provider  sulfamethoxazole-trimethoprim (BACTRIM DS) 800-160 MG tablet Take 1 tablet by mouth 2 (two) times daily for 7 days. 12/09/20 12/16/20 Yes Lurline Idol, FNP  ARIPiprazole (ABILIFY) 5 MG tablet Take 5 mg by mouth daily.    [provider]  azithromycin (ZITHROMAX) 250 MG tablet Take 1 tablet (250 mg total) by mouth daily. Take first 2 tablets together, then 1 every day until finished. 12/09/12   Elpidio Anis, PA-C  benzonatate (TESSALON) 100 MG capsule Take 1 capsule (100 mg total) by mouth 3 (three) times daily as needed for cough. 07/10/16   Everlene Farrier, PA-C  cyclobenzaprine (FLEXERIL) 10 MG tablet Take 20 mg by mouth 3 (three) times daily as needed for muscle spasms.    [provider]  cycloSPORINE (RESTASIS) 0.05 % ophthalmic emulsion Place 1 drop into both eyes 2 (two) times daily.    [provider]  fluticasone (FLONASE) 50 MCG/ACT nasal spray Place 2 sprays into both nostrils daily. 07/10/16   Everlene Farrier, PA-C  furosemide (LASIX) 40 MG tablet Take 40 mg by mouth daily.    [provider]  levothyroxine (SYNTHROID, LEVOTHROID) 150 MCG tablet Take 150 mcg by mouth daily.    [provider]  lidocaine (LIDODERM) 5 % Place 1 patch onto the skin  daily. Remove & Discard patch within 12 hours or as directed by MD 08/27/18   Aviva Kluver B, PA-C  loratadine (CLARITIN) 10 MG tablet Take 1 tablet (10 mg total) by mouth daily. 12/09/12   Elpidio Anis, PA-C  meclizine (ANTIVERT) 25 MG tablet Take 1 tablet (25 mg total) by mouth 3 (three) times daily as needed for dizziness. 04/20/18   Wynetta Fines, MD  naproxen (NAPROSYN) 500 MG tablet Take 500 mg by mouth daily as needed (for pain).     [provider]  potassium chloride SA (K-DUR,KLOR-CON) 20 MEQ tablet Take 20 mEq by mouth daily.    [provider]  sertraline (ZOLOFT) 50 MG tablet  Take 50 mg by mouth daily.    [provider]    Family History History reviewed. No pertinent family history.  Social History Social History   Tobacco Use  . Smoking status: Former Smoker    Packs/day: 0.25    Types: Cigarettes    Quit date: 03/02/2009    Years since quitting: 11.7  . Smokeless tobacco: Never Used  Vaping Use  . Vaping Use: Never used  Substance Use Topics  . Alcohol use: Yes  . Drug use: No     Allergies   Lamotrigine   Review of Systems Review of Systems  Constitutional: Negative for fever.  Gastrointestinal: Negative for nausea and vomiting.  Musculoskeletal: Negative for myalgias.  Skin: Positive for wound.  Neurological: Positive for headaches.  All other systems reviewed and are negative.    Physical Exam Triage Vital Signs ED Triage Vitals  Enc Vitals Group     BP 12/09/20 1105 (!) 129/59     Pulse Rate 12/09/20 1103 98     Resp 12/09/20 1103 17     Temp 12/09/20 1103 98.1 F (36.7 C)     Temp Source 12/09/20 1103 Oral     SpO2 12/09/20 1103 100 %     Weight --      Height --      Head Circumference --      Peak Flow --      Pain Score 12/09/20 1102 4     Pain Loc --      Pain Edu? --      Excl. in GC? --    No data found.  Updated Vital Signs BP (!) 129/59   Pulse 98   Temp 98.1 F (36.7 C) (Oral)   Resp 17   LMP  (LMP Unknown)   SpO2 100%   Visual Acuity Right Eye Distance:   Left Eye Distance:   Bilateral Distance:    Right Eye Near:   Left Eye Near:    Bilateral Near:     Physical Exam Vitals reviewed.  Constitutional:      Appearance: Normal appearance. She is not ill-appearing or toxic-appearing.  HENT:     Head: Normocephalic.  Cardiovascular:     Rate and Rhythm: Normal rate.  Pulmonary:     Effort: Pulmonary effort is normal.  Genitourinary:   Musculoskeletal:        General: Normal range of motion.     Cervical back: Normal range of motion and neck supple.  Skin:    General: Skin  is warm and dry.  Neurological:     General: No focal deficit present.     Mental Status: She is alert and oriented to person, place, and time.  Psychiatric:        Mood and Affect:  Mood normal.        Behavior: Behavior normal.      UC Treatments / Results  Labs (all labs ordered are listed, but only abnormal results are displayed) Labs Reviewed  HSV 1 ANTIBODY, IGG  HSV 2 ANTIBODY, IGG    EKG   Radiology No results found.  Procedures Procedures (including critical care time)  Medications Ordered in UC Medications - No data to display  Initial Impression / Assessment and Plan / UC Course  I have reviewed the triage vital signs and the nursing notes.  Pertinent labs & imaging results that were available during my care of the patient were reviewed by me and considered in my medical decision making (see chart for details).    60 yo female presenting with two cutaneous abscesses to the vaginal region. She is afebrile and non-toxic appearing. Physical exam revealed an abscess which has already drained as well as a much smaller, possible developing abscess that I&D is not indicated. Will start patient on antibiotics empirically. Advised to continue moist, warm compresses. Patient will also be tested for HSV considering her concern for possible herpes.    Today's evaluation has revealed no signs of a dangerous process. Discussed diagnosis with patient and/or guardian. Patient and/or guardian aware of their diagnosis, possible red flag symptoms to watch out for and need for close follow up. Patient and/or guardian understands verbal and written discharge instructions. Patient and/or guardian comfortable with plan and disposition.  Patient and/or guardian has a clear mental status at this time, good insight into illness (after discussion and teaching) and has clear judgment to make decisions regarding their care  This care was provided during an unprecedented National Emergency due  to the Novel Coronavirus (COVID-19) pandemic. COVID-19 infections and transmission risks place heavy strains on healthcare resources.  As this pandemic evolves, our facility, providers, and staff strive to respond fluidly, to remain operational, and to provide care relative to available resources and information. Outcomes are unpredictable and treatments are without well-defined guidelines. Further, the impact of COVID-19 on all aspects of urgent care, including the impact to patients seeking care for reasons other than COVID-19, is unavoidable during this national emergency. At this time of the global pandemic, management of patients has significantly changed, even for non-COVID positive patients given high local and regional COVID volumes at this time requiring high healthcare system and resource utilization. The standard of care for management of both COVID suspected and non-COVID suspected patients continues to change rapidly at the local, regional, national, and global levels. This patient was worked up and treated to the best available but ever changing evidence and resources available at this current time.   Documentation was completed with the aid of voice recognition software. Transcription may contain typographical errors.  Final Clinical Impressions(s) / UC Diagnoses   Final diagnoses:  Abscess of right genital labia  Vaginal lesion     Discharge Instructions     1. Continue moist warm compresses  2. Take antibiotics as prescribed 3. Follow-up as needed    ED Prescriptions    Medication Sig Dispense Auth. Provider   sulfamethoxazole-trimethoprim (BACTRIM DS) 800-160 MG tablet Take 1 tablet by mouth 2 (two) times daily for 7 days. 14 tablet Lurline Idol, FNP     PDMP not reviewed this encounter.   Lurline Idol, [place] 12/09/20 1223

## 2020-12-09 NOTE — ED Triage Notes (Signed)
Pt c/o a bump on her vaginal area. She states she applied warm compress and states it has not gone away. She states last night the bump started draining.

## 2020-12-09 NOTE — Discharge Instructions (Addendum)
Continue moist warm compresses  Take antibiotics as prescribed Follow-up as needed

## 2020-12-10 LAB — HSV 2 ANTIBODY, IGG: HSV 2 Glycoprotein G Ab, IgG: 12.8 index — ABNORMAL HIGH (ref 0.00–0.90)

## 2020-12-10 LAB — HSV 1 ANTIBODY, IGG: HSV 1 Glycoprotein G Ab, IgG: <0.91 index (ref 0.00–0.90)

## 2022-04-03 ENCOUNTER — Other Ambulatory Visit: Payer: Self-pay

## 2022-04-03 ENCOUNTER — Emergency Department (HOSPITAL_COMMUNITY)
Admission: EM | Admit: 2022-04-03 | Discharge: 2022-04-03 | Disposition: A | Discharge status: Home / Self Care | Payer: Medicaid Other | Attending: Emergency Medicine | Admitting: Emergency Medicine

## 2022-04-03 ENCOUNTER — Encounter (HOSPITAL_COMMUNITY): Payer: Self-pay

## 2022-04-03 DIAGNOSIS — I10 Essential (primary) hypertension: Secondary | ICD-10-CM | POA: Insufficient documentation

## 2022-04-03 DIAGNOSIS — S91312A Laceration without foreign body, left foot, initial encounter: Secondary | ICD-10-CM

## 2022-04-03 DIAGNOSIS — W228XXA Striking against or struck by other objects, initial encounter: Secondary | ICD-10-CM | POA: Diagnosis not present

## 2022-04-03 DIAGNOSIS — Z79899 Other long term (current) drug therapy: Secondary | ICD-10-CM | POA: Insufficient documentation

## 2022-04-03 MED ORDER — LIDOCAINE-EPINEPHRINE (PF) 2 %-1:200000 IJ SOLN
10.0000 mL | Freq: Once | INTRAMUSCULAR | Status: AC
  Administered 2022-04-03: 10 mL

## 2022-04-03 MED ORDER — LIDOCAINE-EPINEPHRINE (PF) 2 %-1:200000 IJ SOLN
INTRAMUSCULAR | Status: AC
  Filled 2022-04-03: qty 20

## 2022-04-03 NOTE — ED Provider Notes (Signed)
[facility] DEPT Provider Note   CSN: [id number] Arrival date & time: 04/03/22  1100     History  Chief Complaint  Patient presents with   Laceration    Anna Hamilton is a 61 y.o. female with medical history of hypertension, PTSD, schizoaffective, vertigo.  Patient presents to ED for evaluation of left foot laceration.  Patient reports that last night she struck her foot on a walker causing a laceration to her left heel.  The patient states that she wrapped this wound and duct tape which did not control the bleeding.  The patient states that this morning she noted the wound to still be bleeding and presented to ED for evaluation.  Patient denies any numbness or tingling into her left foot.  The patient states that she is up-to-date on her tetanus vaccinations.   Laceration      Home Medications Prior to Admission medications   Medication Sig Start Date End Date Taking? Authorizing Provider  ARIPiprazole (ABILIFY) 5 MG tablet Take 5 mg by mouth daily.    [provider]  azithromycin (ZITHROMAX) 250 MG tablet Take 1 tablet (250 mg total) by mouth daily. Take first 2 tablets together, then 1 every day until finished. 12/09/12   Charlann Lange, PA-C  benzonatate (TESSALON) 100 MG capsule Take 1 capsule (100 mg total) by mouth 3 (three) times daily as needed for cough. 07/10/16   Waynetta Pean, PA-C  cyclobenzaprine (FLEXERIL) 10 MG tablet Take 20 mg by mouth 3 (three) times daily as needed for muscle spasms.    [provider]  cycloSPORINE (RESTASIS) 0.05 % ophthalmic emulsion Place 1 drop into both eyes 2 (two) times daily.    [provider]  fluticasone (FLONASE) 50 MCG/ACT nasal spray Place 2 sprays into both nostrils daily. 07/10/16   Waynetta Pean, PA-C  furosemide (LASIX) 40 MG tablet Take 40 mg by mouth daily.    [provider]  levothyroxine (SYNTHROID, LEVOTHROID) 150 MCG tablet Take 150 mcg by mouth daily.     [provider]  lidocaine (LIDODERM) 5 % Place 1 patch onto the skin daily. Remove & Discard patch within 12 hours or as directed by MD 08/27/18   Langston Masker B, PA-C  loratadine (CLARITIN) 10 MG tablet Take 1 tablet (10 mg total) by mouth daily. 12/09/12   Charlann Lange, PA-C  meclizine (ANTIVERT) 25 MG tablet Take 1 tablet (25 mg total) by mouth 3 (three) times daily as needed for dizziness. 04/20/18   Valarie Merino, MD  naproxen (NAPROSYN) 500 MG tablet Take 500 mg by mouth daily as needed (for pain).     [provider]  potassium chloride SA (K-DUR,KLOR-CON) 20 MEQ tablet Take 20 mEq by mouth daily.    [provider]  sertraline (ZOLOFT) 50 MG tablet Take 50 mg by mouth daily.    [provider]      Allergies    Lamotrigine    Review of Systems   Review of Systems  Skin:  Positive for wound.  All other systems reviewed and are negative.   Physical Exam Updated Vital Signs BP (!) 185/92 (BP Location: Left Arm)   Pulse 65   Temp 97.6 F (36.4 C) (Oral)   Resp 18   Ht 5\' 5"  (1.651 m)   Wt 122 kg   LMP  (LMP Unknown)   SpO2 99%   BMI 44.76 kg/m  Physical Exam Vitals and nursing note reviewed.  Constitutional:  General: She is not in acute distress.    Appearance: She is not ill-appearing, toxic-appearing or diaphoretic.  HENT:     Head: Normocephalic and atraumatic.     Nose: Nose normal. No congestion.     Mouth/Throat:     Mouth: Mucous membranes are moist.     Pharynx: Oropharynx is clear.  Eyes:     Extraocular Movements: Extraocular movements intact.     Conjunctiva/sclera: Conjunctivae normal.     Pupils: Pupils are equal, round, and reactive to light.  Cardiovascular:     Rate and Rhythm: Normal rate and regular rhythm.  Pulmonary:     Effort: Pulmonary effort is normal.     Breath sounds: Normal breath sounds. No wheezing.  Abdominal:     General: Abdomen is flat. Bowel sounds are normal.     Palpations:  Abdomen is soft.     Tenderness: There is no abdominal tenderness.  Musculoskeletal:     Cervical back: Normal range of motion and neck supple. No tenderness.  Skin:    General: Skin is warm and dry.     Capillary Refill: Capillary refill takes less than 2 seconds.     Findings: Laceration present.     Comments: 3 cm laceration of patient left heel bleeding controlled.  No tendon vomit.  Patient has full range of motion of her left foot and ankle.  Neurovascularly intact.  Neurological:     Mental Status: She is alert and oriented to person, place, and time.     ED Results / Procedures / Treatments   Labs (all labs ordered are listed, but only abnormal results are displayed) Labs Reviewed - No data to display  EKG None  Radiology No results found.  Procedures .Marland KitchenLaceration Repair  Date/Time: 04/03/2022 1:52 PM  Performed by: Al Decant, PA-C Authorized by: Al Decant, PA-C   Consent:    Consent obtained:  Verbal   Consent given by:  Patient   Risks, benefits, and alternatives were discussed: yes     Risks discussed:  Infection and need for additional repair Universal protocol:    Patient identity confirmed:  Verbally with patient and arm band Anesthesia:    Anesthesia method:  Local infiltration   Local anesthetic:  Lidocaine 2% WITH epi Laceration details:    Location:  Foot   Foot location:  L heel   Length (cm):  3 Exploration:    Hemostasis achieved with:  Direct pressure Treatment:    Amount of cleaning:  Extensive   Irrigation solution:  Sterile saline   Irrigation volume:  100  mL   Irrigation method:  Pressure wash Skin repair:    Repair method:  Sutures   Suture size:  5-0   Suture material:  Prolene   Suture technique:  Simple interrupted   Number of sutures:  5 Approximation:    Approximation:  Close Repair type:    Repair type:  Simple Post-procedure details:    Dressing:  Bulky dressing   Procedure completion:  Tolerated  well, no immediate complications    Medications Ordered in ED Medications  lidocaine-EPINEPHrine (XYLOCAINE W/EPI) 2 %-1:200000 (PF) injection (  Not Given 04/03/22 1252)  lidocaine-EPINEPHrine (XYLOCAINE W/EPI) 2 %-1:200000 (PF) injection 10 mL (10 mLs Other Given by Other 04/03/22 1147)    ED Course/ Medical Decision Making/ A&P  Medical Decision Making Risk Prescription drug management.   61 year old female presents to ED for evaluation.  Please see HPI for further details.  Patient has a 3 cm laceration to left heel with bleeding controlled.  The patient is up-to-date on her tetanus vaccination.  The patient had her wound repaired per the procedure note.  Patient tolerated procedure well.  The patient will be discharged home and advised to return to her PCP or urgent care in 8 to 10 days for suture removal.  The patient was counseled on signs and symptoms of localized wound infection and she voiced understanding.  The patient had all of her questions answered her satisfaction prior to discharge.  The patient stable at this time for discharge home.  Final Clinical Impression(s) / ED Diagnoses Final diagnoses:  Laceration of left heel, initial encounter    Rx / DC Orders ED Discharge Orders     None         Al Decant, PA-C 04/03/22 1356    Ernie Avena, MD 04/03/22 1453

## 2022-04-03 NOTE — ED Triage Notes (Signed)
Pt reports laceration to left foot after hitting on  walker yesterday.  Bleeding controlled and ambulatory to triage.

## 2022-04-03 NOTE — Discharge Instructions (Signed)
Return to the ED with any new or worsening signs or symptoms such as redness, swelling, drainage of pus from wound Please read attached guide concerning laceration care in adults Please keep wound clean and dry Please return to PCP or urgent care in 8 to 10 days for suture removal

## 2024-04-03 NOTE — Progress Notes (Signed)
 Chief complaint   Chief Complaint  Patient presents with  . Follow-up    Three month follow up and six week follow up post inj.      Assessment   1. Lumbar facet arthropathy  AMB REFERRAL TO PHYSICAL THERAPY EVALUATION AND TREATMENT    2. Spondylosis of lumbar spine  AMB REFERRAL TO PHYSICAL THERAPY EVALUATION AND TREATMENT    3. Lumbar foraminal stenosis  AMB REFERRAL TO PHYSICAL THERAPY EVALUATION AND TREATMENT    4. Chronic pain syndrome  oxyCODONE -acetaminophen  (PERCOCET,ENDOCET) 10-325 mg per tablet    5. Encounter for monitoring opioid maintenance therapy  ToxASSURE Select 13    6. Chronic pain of both knees  AMB REFERRAL TO PHYSICAL THERAPY EVALUATION AND TREATMENT      Plan     Urine drug screen was obtained today. A random complete panel is ordered to monitor for risk of accidental opioid overdose and/or misuse/abuse/diversion. In addition, the UDS is ordered to monitor opioid use and ongoing candidacy, verify compliant use of controlled substances, and/or monitor for use of illicit substances as these may result in harmful interactions   Injections: None planned. Consider targeting lumbar facets in future visits    Adjuvant Medications: None prescribed.  Opioid medications: Continue Percocet 10 mg 3-4 times a day as needed.  Specialized treatments/imaging: Continue PT/HEP. Will refer to water therapy - has issues with land PT due to pain in knees.   Follow up: Follow up in about 3 months (around 07/03/2024) for re-evaluation with Kao NP.  The patient's blood pressure was 140/72  Patient's pain was assessed, documented as positive, unless stated in the HPI, and a follow up plan has been documented in the Plan. Patient's medication list was reviewed and updated if applicable.  Body Mass Index (BMI) screening was documented and if the patient's BMI was greater than 25 or less than 18.5, the patient was asked to follow up with their PCP for a full assessment regarding  weight management. If Fluoroscopy was used during a procedure, the radiation exposure time and number of images were documented within the record.    The patient has been prescribed opiates for greater than 6 weeks and is being for continued use at least every 3 months.  The patient has signed an opioid treatment agreement within the year, which is documented in the record.  No obvious signs of missuse or diversion.  [facility]  Prescription Monitoring Program was reviewed and appeared appropriate.    History of Present Illness   Initial HPI Anna Hamilton is a 63 y.o.year old female with a history of chronic low back pain. Well-established with [facility] for pain management but established with us  due to being out of network with them.    Interval HPI: 04/03/2024  Since having last been seen Anna Hamilton reports her pain is a 9/10.  She complains of diffuse axial lower back pain, her leg symptoms have subsided after her ESI.  She also notes bilateral knee pain which makes it difficult for her to perform home exercises and stretches, she also has difficulty with walking on the treadmill secondary to her knee pain.She is maintained on Percocet 10mg  QID prn.  No other needs today.    Improvement with treatments: see below Activity Level- _adequate  [employer] Behavior- _nil  Procedures: L4-5 interlaminar ESIs in the past - outside clinic 08/30/23 - CESI - 50% 01/23/24 - L5-S1 interlaminar ESI - 50-60%  Current pain medications:  Percocet 10mg  QID prn  Narcotic Violations:  N/a   Pill count: N/a  Last RUDS: N/a  Tests Reviewed    Xray lumbar spine 03/2023   Technique: 2 views lumbar spine   Interpretation: There is grade 1 anterolisthesis of L4 on L5 and L5 on S1. There is mild lower lumbar facet arthropathy. The disc spaces are maintained. There is no fracture. There are apparent calcified uterine fibroids and there are cholecystectomy clips.      Impression: Degenerative change with grade  1 anterolisthesis of L4 on L5 and L5 on S1   Results for orders placed during the hospital encounter of 12/08/22  MRI Spine Cervical WO Contrast  Narrative INDICATION: Left arm numbness.  COMPARISON: Plain films of the cervical spine 04/08/2018  TECHNIQUE: MRI SPINE CERVICAL WO CONTRAST.  FINDINGS: #  Impression Osseous structures: Vertebral body heights are maintained. No acute fracture. No destructive bony changes. #  Alignment: No significant subluxation. #  Cervicomedullary junction: Normal. No cerebellar tonsillar ectopia. #  Spinal cord: No intrinsic spinal cord abnormality.  #  C2-C3: Preservation of disc height. No focal disc herniation or significant stenosis.. #  C3-C4: Preservation of disc height. No focal disc herniation or significant stenosis. #  C4-C5: Preservation of disc height. No focal disc herniation or significant spinal canal stenosis. Uncovertebral spurring and right greater than left facet arthrosis. Moderate right foraminal stenosis. #  C5-C6: Preservation of disc height. No focal disc herniation or significant spinal canal stenosis. Left greater than right uncovertebral spurring and bilateral facet arthrosis. No significant foraminal stenosis. #  C6-C7: Mild loss of disc height. Small broad-based central disc protrusion. Mild narrowing of the spinal canal. No impingement of the spinal cord. Mild uncovertebral spurring and facet arthrosis without significant foraminal stenosis. #  C7-T1: Preservation of disc height. No focal disc herniation or significant stenosis.  #  Paraspinal tissues: Unremarkable  #  Postcontrast images: None given.  #  Additional comments: None.   IMPRESSION: 1.  Multilevel degenerative changes. Small broad-based central disc protrusion C6-7 mildly narrows the spinal canal. No impingement of the spinal cord. Spinal canal otherwise appears widely patent. Moderate right foraminal stenosis C4-5. Correlate with right-sided radicular  symptoms. 2.  No acute bony abnormality. 3.  No intrinsic spinal cord abnormality.  Electronically Signed by: Elsie Dayhoff, MD on 12/12/2022 4:17 PM  EMG/NCS 2019 - Right CTS    I personally reviewed and visualized the imaging studies if available.   Past Medical History    Reviewed and updated this visit by provider: Tobacco  Allergies  Meds  Problems  Med Hx  Surg Hx  Fam Hx  PDMP        Physical Exam  BP: 140/72 Resp:      Pulse: 60        SPO2:     General: Alert and oriented, No acute distress.   Eye: Pupils are equal, round and reactive to light, Normal conjunctiva.   Respiratory:  Respirations are non-labored, Symmetrical chest wall expansion.   Cardiovascular:  Normal rate, Normal peripheral perfusion.   Integumentary:  Warm, Dry, Pink, No rash.     Cognition and Speech:  Oriented, Speech clear and coherent.   Psychiatric:  Cooperative, Appropriate mood & affect, Normal judgment.   Back: Lumbar flexion 90-100, lumbar extension, 5-10, limited by concordant pain in the lumbar paravertebral area. Facet loading maneuvers are positive. Noted lumbar paravertebral muscle spasm.  Extremities: Upper and lower extremity motor strength appears to be grossly intact. Grip strength is intact. Extensor hallucis  longus is intact bilaterally.    Neuro: sensation grossly intact to light touch gait and station normal, stiff legged    Deidre Alice, NP 04/03/2024 / 9:00 AM

## 2024-04-07 ENCOUNTER — Emergency Department (HOSPITAL_COMMUNITY)
Admission: EM | Admit: 2024-04-07 | Discharge: 2024-04-07 | Disposition: A | Discharge status: Home / Self Care | Payer: MEDICAID | Attending: Emergency Medicine | Admitting: Emergency Medicine

## 2024-04-07 ENCOUNTER — Encounter (HOSPITAL_COMMUNITY): Payer: Self-pay | Admitting: Emergency Medicine

## 2024-04-07 ENCOUNTER — Other Ambulatory Visit: Payer: Self-pay

## 2024-04-07 ENCOUNTER — Emergency Department (HOSPITAL_COMMUNITY): Payer: MEDICAID

## 2024-04-07 DIAGNOSIS — R1084 Generalized abdominal pain: Secondary | ICD-10-CM | POA: Diagnosis present

## 2024-04-07 DIAGNOSIS — D259 Leiomyoma of uterus, unspecified: Secondary | ICD-10-CM | POA: Diagnosis not present

## 2024-04-07 DIAGNOSIS — K5900 Constipation, unspecified: Secondary | ICD-10-CM | POA: Diagnosis not present

## 2024-04-07 LAB — URINALYSIS, ROUTINE W REFLEX MICROSCOPIC
Bilirubin Urine: NEGATIVE
Glucose, UA: NEGATIVE mg/dL
Hgb urine dipstick: NEGATIVE
Ketones, ur: NEGATIVE mg/dL
Nitrite: NEGATIVE
Protein, ur: NEGATIVE mg/dL
Specific Gravity, Urine: 1.029 (ref 1.005–1.030)
pH: 5 (ref 5.0–8.0)

## 2024-04-07 LAB — CBC
HCT: 38.8 % (ref 36.0–46.0)
Hemoglobin: 12.7 g/dL (ref 12.0–15.0)
MCH: 30.1 pg (ref 26.0–34.0)
MCHC: 32.7 g/dL (ref 30.0–36.0)
MCV: 91.9 fL (ref 80.0–100.0)
Platelets: 272 K/uL (ref 150–400)
RBC: 4.22 MIL/uL (ref 3.87–5.11)
RDW: 13 % (ref 11.5–15.5)
WBC: 7 K/uL (ref 4.0–10.5)
nRBC: 0 % (ref 0.0–0.2)

## 2024-04-07 LAB — COMPREHENSIVE METABOLIC PANEL WITH GFR
ALT: 21 U/L (ref 0–44)
AST: 28 U/L (ref 15–41)
Albumin: 4.4 g/dL (ref 3.5–5.0)
Alkaline Phosphatase: 69 U/L (ref 38–126)
Anion gap: 13 (ref 5–15)
BUN: 14 mg/dL (ref 8–23)
CO2: 22 mmol/L (ref 22–32)
Calcium: 9.8 mg/dL (ref 8.9–10.3)
Chloride: 105 mmol/L (ref 98–111)
Creatinine, Ser: 0.79 mg/dL (ref 0.44–1.00)
GFR, Estimated: >60 mL/min (ref >60)
Glucose, Bld: 101 mg/dL — ABNORMAL HIGH (ref 70–99)
Potassium: 3.8 mmol/L (ref 3.5–5.1)
Sodium: 140 mmol/L (ref 135–145)
Total Bilirubin: 0.5 mg/dL (ref 0.0–1.2)
Total Protein: 7.3 g/dL (ref 6.5–8.1)

## 2024-04-07 LAB — LIPASE, BLOOD: Lipase: 17 U/L (ref 11–51)

## 2024-04-07 MED ORDER — IOHEXOL 300 MG/ML  SOLN
100.0000 mL | Freq: Once | INTRAMUSCULAR | Status: AC | PRN
  Administered 2024-04-07: 100 mL via INTRAVENOUS

## 2024-04-07 MED ORDER — METAMUCIL SMOOTH TEXTURE 58.6 % PO POWD: 1.0000 | Freq: Every day | ORAL | 12 refills | Status: AC

## 2024-04-07 NOTE — Discharge Instructions (Signed)
 Follow-up with Dr. Rollin or one of his associates in the next 2 to 3 weeks for your constipation issues

## 2024-04-07 NOTE — ED Provider Notes (Signed)
 [facility] AT [facility] Provider Note   CSN: [id number] Arrival date & time: 04/07/24  1559     Patient presents with: Nausea   Anna Hamilton is a 63 y.o. female.  {Add pertinent medical, surgical, social history, OB history to YEP:67052} Patient complains of constipation.   Abdominal Pain      Prior to Admission medications   Medication Sig Start Date End Date Taking? Authorizing Provider  psyllium (METAMUCIL SMOOTH TEXTURE) 58.6 % powder Take 1 packet by mouth daily. 04/07/24  Yes Kamareon Sciandra, MD  ARIPiprazole (ABILIFY) 5 MG tablet Take 5 mg by mouth daily.    [provider]  azithromycin  (ZITHROMAX ) 250 MG tablet Take 1 tablet (250 mg total) by mouth daily. Take first 2 tablets together, then 1 every day until finished. 12/09/12   Odell Balls, PA-C  benzonatate  (TESSALON ) 100 MG capsule Take 1 capsule (100 mg total) by mouth 3 (three) times daily as needed for cough. 07/10/16   Dansie, William, PA-C  cyclobenzaprine  (FLEXERIL ) 10 MG tablet Take 20 mg by mouth 3 (three) times daily as needed for muscle spasms.    [provider]  cycloSPORINE (RESTASIS) 0.05 % ophthalmic emulsion Place 1 drop into both eyes 2 (two) times daily.    [provider]  fluticasone  (FLONASE ) 50 MCG/ACT nasal spray Place 2 sprays into both nostrils daily. 07/10/16   Dansie, William, PA-C  furosemide (LASIX) 40 MG tablet Take 40 mg by mouth daily.    [provider]  levothyroxine (SYNTHROID, LEVOTHROID) 150 MCG tablet Take 150 mcg by mouth daily.    [provider]  lidocaine  (LIDODERM ) 5 % Place 1 patch onto the skin daily. Remove & Discard patch within 12 hours or as directed by MD 08/27/18   Jason Fish B, PA-C  loratadine  (CLARITIN ) 10 MG tablet Take 1 tablet (10 mg total) by mouth daily. 12/09/12   Odell Balls, PA-C  meclizine  (ANTIVERT ) 25 MG tablet Take 1 tablet (25 mg total) by mouth 3 (three) times daily as  needed for dizziness. 04/20/18   Laurice Maude BROCKS, MD  naproxen (NAPROSYN) 500 MG tablet Take 500 mg by mouth daily as needed (for pain).     [provider]  potassium chloride SA (K-DUR,KLOR-CON) 20 MEQ tablet Take 20 mEq by mouth daily.    [provider]  sertraline (ZOLOFT) 50 MG tablet Take 50 mg by mouth daily.    [provider]    Allergies: Lamotrigine    Review of Systems  Gastrointestinal:  Positive for abdominal pain.    Updated Vital Signs BP (!) 128/58   Pulse 63   Temp 98.5 F (36.9 C)   Resp 18   LMP  (LMP Unknown)   SpO2 94%   Physical Exam  (all labs ordered are listed, but only abnormal results are displayed) Labs Reviewed  COMPREHENSIVE METABOLIC PANEL WITH GFR - Abnormal; Notable for the following components:      Result Value   Glucose, Bld 101 (*)    All other components within normal limits  URINALYSIS, ROUTINE W REFLEX MICROSCOPIC - Abnormal; Notable for the following components:   APPearance HAZY (*)    Leukocytes,Ua SMALL (*)    Bacteria, UA RARE (*)    All other components within normal limits  LIPASE, BLOOD  CBC    EKG: None  Radiology: CT ABDOMEN PELVIS W CONTRAST Result Date: 04/07/2024 CLINICAL DATA:  Acute nonlocalized abdominal pain. Nausea. No bowel  movements for 3 days. EXAM: CT ABDOMEN AND PELVIS WITH CONTRAST TECHNIQUE: Multidetector CT imaging of the abdomen and pelvis was performed using the standard protocol following bolus administration of intravenous contrast. RADIATION DOSE REDUCTION: This exam was performed according to the departmental dose-optimization program which includes automated exposure control, adjustment of the mA and/or kV according to patient size and/or use of iterative reconstruction technique. CONTRAST:  OMNIPAQUE  IOHEXOL  300 MG/ML  SOLN COMPARISON:  12/23/2007 FINDINGS: Lower chest: Patchy infiltrates in the lung bases peripherally. This could indicate areas of atelectasis,  scarring, or multifocal pneumonia. Hepatobiliary: Mild diffuse fatty infiltration of the liver. No focal lesions. Surgical absence of the gallbladder. No bile duct dilatation. Pancreas: Unremarkable. No pancreatic ductal dilatation or surrounding inflammatory changes. Spleen: Normal in size without focal abnormality. Adrenals/Urinary Tract: No adrenal gland nodules. Small renal cyst bilaterally, largest measuring up to about 2.3 cm. These are mildly increased since the prior study but have benign appearance. No imaging follow-up is indicated. No hydronephrosis or hydroureter. Bladder is normal. Stomach/Bowel: Stomach, small bowel, and colon are not abnormally distended. No wall thickening or inflammatory stranding are indicated. Scattered colonic diverticula without evidence of acute diverticulitis. Appendix is normal. Vascular/Lymphatic: No significant vascular findings are present. No enlarged abdominal or pelvic lymph nodes. Reproductive: Diffusely enlarged and lobular appearance of the uterus with multiple myometrial calcifications consistent with multiple uterine fibroids. No abnormal adnexal masses. Other: No free air or free fluid in the abdomen. Abdominal wall musculature appears intact. Musculoskeletal: Mild degenerative changes in the lumbar spine. No acute bony abnormalities. IMPRESSION: 1. No acute process demonstrated in the abdomen or pelvis. No evidence of bowel obstruction or inflammation. 2. Diffuse fatty infiltration of the liver. 3. Multiple uterine fibroids, some with calcification. 4. Patchy peripheral infiltrates in the lung bases possibly pneumonia, atelectasis, or scarring. Electronically Signed   By: Elsie Gravely M.D.   On: 04/07/2024 22:30    {Document cardiac monitor, telemetry assessment procedure when appropriate:32947} Procedures   Medications Ordered in the ED  iohexol  (OMNIPAQUE ) 300 MG/ML solution 100 mL (100 mLs Intravenous Contrast Given 04/07/24 2207)      {Click  here for ABCD2, HEART and other calculators REFRESH Note before signing:1}                              Medical Decision Making Amount and/or Complexity of Data Reviewed Labs: ordered. Radiology: ordered.  Risk OTC drugs. Prescription drug management.   Patient with mild constipation and known uterine fibroids.  She will start on Metamucil and follow-up with GI  {Document critical care time when appropriate  Document review of labs and clinical decision tools ie CHADS2VASC2, etc  Document your independent review of radiology images and any outside records  Document your discussion with family members, caretakers and with consultants  Document social determinants of health affecting pt's care  Document your decision making why or why not admission, treatments were needed:32947:::1}   Final diagnoses:  Constipation, unspecified constipation type    ED Discharge Orders          Ordered    psyllium (METAMUCIL SMOOTH TEXTURE) 58.6 % powder  Daily        04/07/24 2307

## 2024-04-07 NOTE — ED Triage Notes (Signed)
 Pt reports nausea and unable to have a bowel movement x 3 days. Pt denies abd pain or fevers.

## 2024-04-29 ENCOUNTER — Ambulatory Visit: Payer: MEDICAID

## 2024-04-29 DIAGNOSIS — Z23 Encounter for immunization: Secondary | ICD-10-CM
